# Patient Record
Sex: Female | Born: 1980 | Race: Black or African American | Hispanic: No | Marital: Married | Smoking: Never smoker
Health system: Southern US, Community
[De-identification: ages and names within clinical notes are randomized; demographics above are authoritative.]

## PROBLEM LIST (undated history)

## (undated) ENCOUNTER — Emergency Department (HOSPITAL_COMMUNITY): Admission: EM

## (undated) DIAGNOSIS — M3500 Sicca syndrome, unspecified: Secondary | ICD-10-CM

## (undated) DIAGNOSIS — T7840XA Allergy, unspecified, initial encounter: Secondary | ICD-10-CM

## (undated) DIAGNOSIS — O24419 Gestational diabetes mellitus in pregnancy, unspecified control: Secondary | ICD-10-CM

## (undated) DIAGNOSIS — J45909 Unspecified asthma, uncomplicated: Secondary | ICD-10-CM

## (undated) DIAGNOSIS — R072 Precordial pain: Secondary | ICD-10-CM

## (undated) DIAGNOSIS — R002 Palpitations: Secondary | ICD-10-CM

## (undated) HISTORY — DX: Palpitations: R00.2

## (undated) HISTORY — DX: Precordial pain: R07.2

## (undated) HISTORY — PX: CERVIX SURGERY: SHX593

## (undated) HISTORY — PX: UMBILICAL HERNIA REPAIR: SHX196

## (undated) HISTORY — PX: TONSILLECTOMY: SUR1361

## (undated) HISTORY — DX: Sjogren syndrome, unspecified: M35.00

## (undated) HISTORY — DX: Gestational diabetes mellitus in pregnancy, unspecified control: O24.419

## (undated) HISTORY — DX: Allergy, unspecified, initial encounter: T78.40XA

## (undated) HISTORY — PX: HERNIA REPAIR: SHX51

## (undated) HISTORY — PX: BRAVO PH STUDY: SHX5421

---

## 2008-06-16 HISTORY — PX: UMBILICAL HERNIA REPAIR: SHX196

## 2021-04-19 ENCOUNTER — Other Ambulatory Visit: Payer: Self-pay

## 2021-04-19 DIAGNOSIS — O26851 Spotting complicating pregnancy, first trimester: Secondary | ICD-10-CM | POA: Diagnosis not present

## 2021-04-19 DIAGNOSIS — J45909 Unspecified asthma, uncomplicated: Secondary | ICD-10-CM | POA: Diagnosis not present

## 2021-04-19 DIAGNOSIS — R102 Pelvic and perineal pain: Secondary | ICD-10-CM | POA: Diagnosis not present

## 2021-04-19 DIAGNOSIS — O209 Hemorrhage in early pregnancy, unspecified: Secondary | ICD-10-CM

## 2021-04-19 LAB — URINALYSIS, COMPLETE (UACMP) WITH MICROSCOPIC
Bilirubin Urine: NEGATIVE
Glucose, UA: NEGATIVE mg/dL
Ketones, ur: NEGATIVE mg/dL
Nitrite: NEGATIVE
Protein, ur: NEGATIVE mg/dL
Specific Gravity, Urine: 1.02 (ref 1.005–1.030)
pH: 6 (ref 5.0–8.0)

## 2021-04-19 LAB — BASIC METABOLIC PANEL
Anion gap: 6 (ref 5–15)
BUN: 13 mg/dL (ref 6–20)
CO2: 26 mmol/L (ref 22–32)
Calcium: 9.1 mg/dL (ref 8.9–10.3)
Chloride: 105 mmol/L (ref 98–111)
Creatinine, Ser: 0.84 mg/dL (ref 0.44–1.00)
GFR, Estimated: >60 mL/min (ref >60)
Glucose, Bld: 105 mg/dL — ABNORMAL HIGH (ref 70–99)
Potassium: 3.7 mmol/L (ref 3.5–5.1)
Sodium: 137 mmol/L (ref 135–145)

## 2021-04-19 LAB — CBC
HCT: 33.4 % — ABNORMAL LOW (ref 36.0–46.0)
Hemoglobin: 11.2 g/dL — ABNORMAL LOW (ref 12.0–15.0)
MCH: 28.8 pg (ref 26.0–34.0)
MCHC: 33.5 g/dL (ref 30.0–36.0)
MCV: 85.9 fL (ref 80.0–100.0)
Platelets: 234 10*3/uL (ref 150–400)
RBC: 3.89 MIL/uL (ref 3.87–5.11)
RDW: 13.7 % (ref 11.5–15.5)
WBC: 7.7 10*3/uL (ref 4.0–10.5)
nRBC: 0 % (ref 0.0–0.2)

## 2021-04-19 LAB — ABO/RH: ABO/RH(D): B POS

## 2021-04-19 LAB — POC URINE PREG, ED: Preg Test, Ur: NEGATIVE

## 2021-04-19 NOTE — ED Triage Notes (Signed)
Pt presents to ER c/o vaginal bleeding that started yesterday and was brown.  Pt states today she has had spotty, bright red vaginal bleeding.  Pt c/o intermittent sharp lower abd pain.  Pt states she thinks she is around 5-[redacted] weeks pregnant at this time.  Pt has hx of 3 previous miscarriages.  Pt has not had first OB appt yet.

## 2021-04-20 ENCOUNTER — Emergency Department: Payer: Medicaid Other

## 2021-04-20 ENCOUNTER — Emergency Department
Admission: EM | Admit: 2021-04-20 | Discharge: 2021-04-20 | Disposition: A | Discharge status: Home / Self Care | Payer: Medicaid Other | Attending: Emergency Medicine | Admitting: Emergency Medicine

## 2021-04-20 DIAGNOSIS — Z8759 Personal history of other complications of pregnancy, childbirth and the puerperium: Secondary | ICD-10-CM

## 2021-04-20 DIAGNOSIS — O3680X0 Pregnancy with inconclusive fetal viability, not applicable or unspecified: Secondary | ICD-10-CM

## 2021-04-20 DIAGNOSIS — O209 Hemorrhage in early pregnancy, unspecified: Secondary | ICD-10-CM

## 2021-04-20 DIAGNOSIS — R102 Pelvic and perineal pain: Secondary | ICD-10-CM

## 2021-04-20 HISTORY — DX: Sjogren syndrome, unspecified: M35.00

## 2021-04-20 HISTORY — DX: Unspecified asthma, uncomplicated: J45.909

## 2021-04-20 HISTORY — DX: Personal history of other complications of pregnancy, childbirth and the puerperium: Z87.59

## 2021-04-20 LAB — HCG, QUANTITATIVE, PREGNANCY: hCG, Beta Chain, Quant, S: 17 m[IU]/mL — ABNORMAL HIGH (ref <5)

## 2021-04-20 NOTE — Discharge Instructions (Addendum)
As we discussed, you need to have your pregnancy hormones rechecked in 7 days to make sure that this is not a early pregnancy, ectopic pregnancy, or to confirm that this is a miscarriage.  Follow-up at the health department, with your OB/GYN, or return here for that.  Also return to the emergency room if you have severe abdominal pain

## 2021-04-20 NOTE — ED Notes (Signed)
Pt assessed by provider prior to discharge

## 2021-04-20 NOTE — ED Provider Notes (Signed)
[facility] Emergency Department Provider Note  ____________________________________________  Time seen: Approximately 4:59 AM  I have reviewed the triage vital signs and the nursing notes.   HISTORY  Chief Complaint Vaginal Bleeding   HPI Victoria Hobbs is a 40 y.o. female who presents for evaluation of vaginal bleeding.  Patient reports that her last period was about 6 weeks ago.  She had a positive pregnancy test at home.  yesterday she started having vaginal bleeding and passing clots.  The bleeding has been intermittent and moderate in intensity.  She is complaining of mild intermittent cramping suprapubic abdominal pain associated with it.  Patient reports that this is her 6 pregnancy.  She has 2 children and the other ones end up in miscarriage.  She has a history of uterine fibroids.  She denies any dizziness, chest pain, shortness of breath   Past Medical History:  Diagnosis Date   Asthma    Sjogren's disease ([place])     There are no problems to display for this patient.   History reviewed. No pertinent surgical history.  Prior to Admission medications   Not on File    Allergies Flagyl [metronidazole]  History reviewed. No pertinent family history.  Social History Social History   Tobacco Use   Smoking status: Never   Smokeless tobacco: Never  Substance Use Topics   Alcohol use: Never   Drug use: Never    Review of Systems  Constitutional: Negative for fever. Eyes: Negative for visual changes. ENT: Negative for sore throat. Neck: No neck pain  Cardiovascular: Negative for chest pain. Respiratory: Negative for shortness of breath. Gastrointestinal: + suprapubic cramping. Negative for abdominal pain, vomiting or diarrhea. Genitourinary: Negative for dysuria. + vaginal bleeding Musculoskeletal: Negative for back pain. Skin: Negative for rash. Neurological: Negative for headaches, weakness or numbness. Psych: No SI or  HI  ____________________________________________   PHYSICAL EXAM:  VITAL SIGNS: ED Triage Vitals  Enc Vitals Group     BP 04/19/21 2315 (!) 141/90     Pulse Rate 04/19/21 2315 69     Resp 04/19/21 2315 18     Temp 04/19/21 2315 98.4 F (36.9 C)     Temp Source 04/19/21 2315 Oral     SpO2 04/19/21 2315 100 %     Weight 04/19/21 2325 185 lb (83.9 kg)     Height 04/19/21 2325 5\' 4"  (1.626 m)     Head Circumference --      Peak Flow --      Pain Score 04/19/21 2316 2     Pain Loc --      Pain Edu? --      Excl. in [place]? --     Constitutional: Alert and oriented. Well appearing and in no apparent distress. HEENT:      Head: Normocephalic and atraumatic.         Eyes: Conjunctivae are normal. Sclera is non-icteric.       Mouth/Throat: Mucous membranes are moist.       Neck: Supple with no signs of meningismus. Cardiovascular: Regular rate and rhythm. No murmurs, gallops, or rubs. 2+ symmetrical distal pulses are present in all extremities. No JVD. Respiratory: Normal respiratory effort. Lungs are clear to auscultation bilaterally.  Gastrointestinal: Soft, non tender, and non distended with positive bowel sounds. No rebound or guarding. Genitourinary: No CVA tenderness. Musculoskeletal:  No edema, cyanosis, or erythema of extremities. Neurologic: Normal speech and language. Face is symmetric. Moving all extremities. No gross focal  neurologic deficits are appreciated. Skin: Skin is warm, dry and intact. No rash noted. Psychiatric: Mood and affect are normal. Speech and behavior are normal.  ____________________________________________   LABS (all labs ordered are listed, but only abnormal results are displayed)  Labs Reviewed  HCG, QUANTITATIVE, PREGNANCY - Abnormal; Notable for the following components:      Result Value   hCG, Beta Chain, Quant, S 17 (*)    All other components within normal limits  CBC - Abnormal; Notable for the following components:   Hemoglobin 11.2  (*)    HCT 33.4 (*)    All other components within normal limits  BASIC METABOLIC PANEL - Abnormal; Notable for the following components:   Glucose, Bld 105 (*)    All other components within normal limits  URINALYSIS, COMPLETE (UACMP) WITH MICROSCOPIC - Abnormal; Notable for the following components:   Color, Urine YELLOW (*)    APPearance CLEAR (*)    Hgb urine dipstick LARGE (*)    Leukocytes,Ua TRACE (*)    Bacteria, UA RARE (*)    All other components within normal limits  POC URINE PREG, ED  ABO/RH   ____________________________________________  EKG  none  ____________________________________________  RADIOLOGY  I have personally reviewed the images performed during this visit and I agree with the Radiologist's read.   Interpretation by Radiologist:  US OB LESS THAN 14 WEEKS W/ OB TRANSVAGINAL AND DOPPLER  Result Date: 04/20/2021 CLINICAL DATA:  Vaginal bleeding. EXAM: OBSTETRIC <14 WK [place] AND TRANSVAGINAL OB US DOPPLER ULTRASOUND OF OVARIES TECHNIQUE: Both transabdominal and transvaginal ultrasound examinations were performed for complete evaluation of the gestation as well as the maternal uterus, adnexal regions, and pelvic cul-de-sac. Transvaginal technique was performed to assess early pregnancy. Color and duplex Doppler ultrasound was utilized to evaluate blood flow to the ovaries. COMPARISON:  None. FINDINGS: Intrauterine gestational sac: None Yolk sac:  Not visualized. Embryo:  Not Visualized. Cardiac Activity: No embryo. Subchorionic hemorrhage:  None visualized. Maternal uterus/adnexae: The uterus measures 9.9 x 4.9 by 5.7 cm with an endometrial complex thickness of 6.7 mm and unremarkable endometrial complex. There is a transmural fundal fibroid measuring 1.9 cm and a subserosal fundal fibroid of 1.0 cm with anteverted uterine positioning. Both ovaries are sonographically unremarkable, normal in size, with normal arterial and venous flow registration. No free fluid or  adnexal mass or observed. Pulsed Doppler evaluation of both ovaries demonstrates normal appearing low-resistance arterial and venous waveforms. IMPRESSION: 1. No intrauterine gestational sac or adnexal mass or free fluid are observed. 2. Differential diagnosis is early dates, failed pregnancy and ectopic pregnancy. Follow-up as indicated. 3. 2 small fibroids as described above. Electronically Signed   By: Telford Nab M.D.   On: 04/20/2021 01:51     ____________________________________________   PROCEDURES  Procedure(s) performed: None Procedures   Critical Care performed:  None ____________________________________________   INITIAL IMPRESSION / ASSESSMENT AND PLAN / ED COURSE   40 y.o. female who presents for evaluation of vaginal bleeding. LMP 6 weeks ago with a + home pregnancy test.  Hemodynamically stable, stable hemoglobin.  hCG of 17 with an ultrasound showing no signs of a pregnancy.  Patient is B+ no indication for RhoGAM.  I did discuss with the patient that this is most likely a miscarriage but with hCG of 17 I would not expect to see anything on ultrasound and therefore we cannot rule out a normal IUP versus ectopic pregnancy.  Recommended repeating hCG hormone level in a week.  Also discussed recommendations of returning to the hospital for severe abdominal pain since we are unable to rule out an ectopic pregnancy at this time.  I also discussed signs and symptoms of acute blood loss anemia recommended she returns if those develop.  Otherwise referral to OB/GYN for trending of hCG.  Old medical records reviewed      _____________________________________________ Please note:  Patient was evaluated in Emergency Department today for the symptoms described in the history of present illness. Patient was evaluated in the context of the global COVID-19 pandemic, which necessitated consideration that the patient might be at risk for infection with the SARS-CoV-2 virus that causes  COVID-19. Institutional protocols and algorithms that pertain to the evaluation of patients at risk for COVID-19 are in a state of rapid change based on information released by regulatory bodies including the CDC and federal and state organizations. These policies and algorithms were followed during the patient's care in the ED.  Some ED evaluations and interventions may be delayed as a result of limited staffing during the pandemic.   [place] Controlled Substance Database was reviewed by me. ____________________________________________   FINAL CLINICAL IMPRESSION(S) / ED DIAGNOSES   Final diagnoses:  Vaginal bleeding affecting early pregnancy  Pelvic pain  Pregnancy of unknown anatomic location      NEW MEDICATIONS STARTED DURING THIS VISIT:  ED Discharge Orders     None        Note:  This document was prepared using Dragon voice recognition software and may include unintentional dictation errors.    Rudene Re, MD 04/20/21 [phone]

## 2021-07-06 DIAGNOSIS — L210 Seborrhea capitis: Secondary | ICD-10-CM | POA: Insufficient documentation

## 2021-07-06 DIAGNOSIS — L509 Urticaria, unspecified: Secondary | ICD-10-CM | POA: Insufficient documentation

## 2021-07-06 DIAGNOSIS — J45909 Unspecified asthma, uncomplicated: Secondary | ICD-10-CM | POA: Insufficient documentation

## 2021-07-06 DIAGNOSIS — J309 Allergic rhinitis, unspecified: Secondary | ICD-10-CM | POA: Insufficient documentation

## 2021-07-06 DIAGNOSIS — L309 Dermatitis, unspecified: Secondary | ICD-10-CM | POA: Insufficient documentation

## 2021-07-29 NOTE — Progress Notes (Signed)
Primary Care Provider: Theodoro Clock [facility] Cardiologist: None Electrophysiologist: None  Clinic Note: Chief Complaint  Patient presents with   Other    Sjogren syndrome c/o heart palpitations with chest/shoulder dull pain. Meds reviewed verbally with pt.   ===================================  ASSESSMENT/PLAN   Problem List Items Addressed This Visit     Precordial chest pain    She does not have that many risk factors with exception of significant family history of rheumatologic disorders.  With systemic disease, need to exclude cardiac involvement.  We will check combination of 2D echocardiogram and Coronary CTA (with possible FFRCT)      Relevant Orders   EKG 12-Lead (Completed)   ECHOCARDIOGRAM COMPLETE   Basic Metabolic Panel (BMET)   DOE (dyspnea on exertion)    Probably deconditioning and related to asthma.  However with the increased risk based on rheumatologic disorder, complete baseline cardiac evaluation especially in setting of precordial pain.  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  Also need to evaluate for pulmonary hypertension.  Plan: Check 2D echo      Relevant Medications   SYMBICORT 80-4.5 MCG/ACT inhaler   montelukast (SINGULAIR) 10 MG tablet   albuterol (VENTOLIN HFA) 108 (90 Base) MCG/ACT inhaler   Sjogren syndrome with other organ involvement ([place]) (Chronic)    She sees a pretty stable overall from a symptom standpoint [place] Sjogren's.  However now she has been having some episodes of dyspnea and chest discomfort.  Need to exclude cardiac involvement..  For full cardiac evaluation: 2D Echocardiogram and Coronary CTA      Relevant Orders   EKG 12-Lead (Completed)   ECHOCARDIOGRAM  COMPLETE   Other Visit Diagnoses     Precordial pain    -  Primary   Relevant Orders   CT CORONARY MORPH W/CTA COR W/SCORE W/CA W/CM &/OR WO/CM       ===================================  HPI:    Amiel Sharrow is a 41 y.o. female with history of Uterine Fibroids and Sjogren's Syndrome, and Asthma/Allergic Rhinitis who is being seen today for the evaluation of CHEST PAIN and SHORTNESS OF BREATH ON EXERTION along with Palpitations at the request of Terald Sleeper, PA-C.  Domingue Coltrain was seen Particia Nearing, PA on July 06, 2021 for routine evaluation.  She did note some exertional chest pain.  She also indicated in the past she had been followed by a cardiologist for her Sjogren's.  Had not been seen a few years ago.  She was referred to rheumatology, endocrinology and cardiology because of her history of Sjogren's.  Recent Hospitalizations:  [facility] ER 04/20/2021: Noted some vaginal bleeding.  Last menstrual cycle was 6 weeks prior.  Home positive stress test.  Was concerned because she was passing clots => miscarriage.  This was her sixth pregnancy with 2 children and 3 (now 4) miscarriages.  Reviewed  CV studies:    The following studies were reviewed today: (if available, images/films reviewed: From Epic Chart or Care Everywhere) She tells me in the past she has had at least a stress test and probably an echocardiogram, but has been several years since last evaluation.  Had not seen her cardiologist in [place] for several years.  Interval History:  Desyre Calma presents here today for evaluation, stating that she is actually feeling better overall.  She states that in early December she had an episode of a dull heavy chest pain and pressure that lasted for several minutes.  She thought it was related to her asthma, but did not have any wheezing at that time.  She said that throughout the month of December she was having occasional episodes of heavy sensation, but they are not really  occurring now.  She is also been having some shortness of breath with exertion that she has attributed to many things, but was concerned because of associated with some of the chest discomfort timing.  The chest comfort may or may not of been associated with exertion indicating that sometimes happen at rest and sometimes with exertion.  She has had rare skipping heartbeats but did not occur very often.  Not frequent enough to potentially capture on a monitor.  Maybe once or twice a month at the most.  Not overly worrisome to her just she feels her heart skipping a few times.  She told me that she actually stopped taking her Plaquenil around the time she found out that she was pregnant, but never restarted it after her miscarriage.  CV Review of Symptoms (Summary) Cardiovascular ROS: positive for - chest pain, dyspnea on exertion, irregular heartbeat, and palpitations negative for - edema, orthopnea, paroxysmal nocturnal dyspnea, rapid heart rate, shortness of breath, or  lightheadedness, dizziness, weakness or syncope/near syncope or TIA/amaurosis fugax, claudication  REVIEWED OF SYSTEMS   Review of Systems  Constitutional:  Negative for malaise/fatigue and weight loss.  HENT:  Negative for congestion and nosebleeds.   Respiratory:  Positive for cough (With asthma), shortness of breath (Exertional) and wheezing (With asthma). Negative for hemoptysis.   Cardiovascular:        Per HPI  Gastrointestinal:  Negative for abdominal pain, blood in stool and melena.  Genitourinary:  Negative for dysuria and hematuria.  Musculoskeletal:  Negative for joint pain.  Neurological:  Negative for dizziness.  Psychiatric/Behavioral:  Negative for depression and memory loss. The patient is not nervous/anxious and does not have insomnia.    I have reviewed and (if needed) personally updated the patient's problem list, medications, allergies, past medical and surgical history, social and family history.   PAST  MEDICAL HISTORY   Past Medical History:  Diagnosis Date   Asthma    History of miscarriage 04/20/2021   Fourth miscarriage with 2 births out of 6   Sjogren's syndrome ([place])     PAST SURGICAL HISTORY   Past Surgical History:  Procedure Laterality Date   BRAVO [place] STUDY     CERVIX SURGERY     TONSILLECTOMY     UMBILICAL HERNIA REPAIR  2010    There is no immunization history on file for this patient.  MEDICATIONS/ALLERGIES   Current Meds  Medication Sig   albuterol (VENTOLIN HFA) 108 (90 Base) MCG/ACT inhaler daily.   cetirizine (ZYRTEC) 10 MG tablet Take 10 mg by mouth daily.   fluticasone (FLONASE) 50 MCG/ACT nasal spray in the morning and at bedtime.   hydroxychloroquine (PLAQUENIL) 200 MG tablet Take 200 mg by mouth 2 (two) times daily.   metoprolol tartrate (LOPRESSOR) 100 MG tablet Take 1 tablet (100 mg total) by mouth once for 1 dose. Take two hours prior to your CT.   montelukast (SINGULAIR) 10 MG tablet at bedtime.   SYMBICORT 80-4.5 MCG/ACT inhaler Inhale 2 puffs into the lungs 2 (  two) times daily.    Allergies  Allergen Reactions   Flagyl [Metronidazole] Anaphylaxis and Hives    SOCIAL HISTORY/FAMILY HISTORY   Reviewed in Epic:   Social History   Tobacco Use   Smoking status: Never   Smokeless tobacco: Never  Substance Use Topics   Alcohol use: Never   Drug use: Never   Social History   Social History Narrative   Moved from [place] about a year ago.  Had previously been followed by cardiologist there.   Family History  Problem Relation Age of Onset   Rheum arthritis Mother        Several female relatives had rheumatologic disorders including RA, lupus, Sjogren's etc.   Heart attack Maternal Grandmother     OBJCTIVE -PE, EKG, labs   Wt Readings from Last 3 Encounters:  08/01/21 192 lb 4 oz (87.2 kg)  04/19/21 185 lb (83.9 kg)    Physical Exam: BP 110/72 (BP Location: Right Arm, Patient Position: Sitting, Cuff Size: Normal)    Pulse 87     Ht 5\' 4"  (1.626 m)    Wt 192 lb 4 oz (87.2 kg)    LMP 03/14/2021 (Exact Date)    SpO2 98%    BMI 33.00 kg/m  Physical Exam Vitals reviewed.  Constitutional:      General: She is not in acute distress.    Appearance: Normal appearance. She is normal weight. She is not ill-appearing or toxic-appearing.  HENT:     Head: Normocephalic and atraumatic.  Neck:     Vascular: No carotid bruit or JVD.  Cardiovascular:     Rate and Rhythm: Normal rate and regular rhythm. No extrasystoles are present.    Chest Wall: PMI is not displaced.     Pulses: Normal pulses.     Heart sounds: Normal heart sounds, S1 normal and S2 normal. No murmur (Cannot exclude soft systolic murmur.) heard.   No friction rub. No gallop.  Pulmonary:     Effort: Pulmonary effort is normal. No respiratory distress.     Breath sounds: Normal breath sounds. No wheezing, rhonchi or rales.  Chest:     Chest wall: No tenderness.  Abdominal:     General: Abdomen is flat. Bowel sounds are normal. There is no distension.     Palpations: Abdomen is soft. There is no mass.     Tenderness: There is no abdominal tenderness.     Comments: No HSM or bruit.  Musculoskeletal:        General: No swelling. Normal range of motion.     Cervical back: Normal range of motion and neck supple.  Skin:    General: Skin is warm and dry.  Neurological:     General: No focal deficit present.     Mental Status: She is alert and oriented to person, place, and time.  Psychiatric:        Mood and Affect: Mood normal.        Behavior: Behavior normal.        Thought Content: Thought content normal.        Judgment: Judgment normal.     Adult ECG Report  Rate: 87 ;  Rhythm: normal sinus rhythm and Normal axis, intervals and durations. ;   Narrative Interpretation: Normal  Recent Labs: Reviewed No results found for: CHOL, HDL, LDLCALC, LDLDIRECT, TRIG, CHOLHDL Lab Results  Component Value Date   CREATININE 0.84 04/19/2021   BUN 13  04/19/2021   NA 137 04/19/2021  K 3.7 04/19/2021   CL 105 04/19/2021   CO2 26 04/19/2021   CBC Latest Ref Rng & Units 04/19/2021  WBC 4.0 - 10.5 K/uL 7.7  Hemoglobin 12.0 - 15.0 g/dL 11.2(L)  Hematocrit 36.0 - 46.0 % 33.4(L)  Platelets 150 - 400 K/uL 234    No results found for: HGBA1C No results found for: TSH  ==================================================  COVID-19 Education: The signs and symptoms of COVID-19 were discussed with the patient and how to seek care for testing (follow up with PCP or arrange E-visit).    I spent a total of 35 minutes with the patient spent in direct patient consultation.  Additional time spent with chart review  / charting (studies, outside notes, etc): 20 min Total Time: 55 min  Current medicines are reviewed at length with the patient today.  (+/- concerns) N/A  This visit occurred during the SARS-CoV-2 public health emergency.  Safety protocols were in place, including screening questions prior to the visit, additional usage of staff PPE, and extensive cleaning of exam room while observing appropriate contact time as indicated for disinfecting solutions.  Notice: This dictation was prepared with Dragon dictation along with smart phrase technology. Any transcriptional errors that result from this process are unintentional and may not be corrected upon review.   Studies Ordered:  Orders Placed This Encounter  Procedures   CT CORONARY MORPH W/CTA COR W/SCORE W/CA W/CM &/OR WO/CM   Basic Metabolic Panel (BMET)   EKG 12-Lead   ECHOCARDIOGRAM COMPLETE    Patient Instructions / Medication Changes & Studies & Tests Ordered   Patient Instructions  Medication Instructions:   Your physician recommends that you continue on your current medications as directed. Please refer to the Current Medication list given to you today.   *If you need a refill on your cardiac medications before your next appointment, please call your pharmacy*   Lab  Work:  Your provider has ordered labs (BMET) to be drawn. Please go to the [facility] to have this done.   Medical Mall Entrance at [facility] 1st desk on the right to check in, past the screening table Lab hours: Monday- Friday (7:30 am- 5:30 pm)   If you have labs (blood work) drawn today and your tests are completely normal, you will receive your results only by: [place] (if you have MyChart) OR A paper copy in the mail If you have any lab test that is abnormal or we need to change your treatment, we will call you to review the results.   Testing/Procedures:  Echocardiogram - Your physician has requested that you have an echocardiogram. Echocardiography is a painless test that uses sound waves to create images of your heart. It provides your doctor with information about the size and shape of your heart and how well your hearts chambers and valves are working. This procedure takes approximately one hour. There are no restrictions for this procedure.    2. Your cardiac CT will be scheduled at:  [facility] [address] [place],  [address] [phone]  Please arrive 15 mins early for check-in and test prep.   Follow-Up: At [facility], you and your health needs are our priority.  As part of our continuing mission to provide you with exceptional heart care, we have created designated Provider Care Teams.  These Care Teams include your primary Cardiologist (physician) and Advanced Practice Providers (APPs -  Physician Assistants and Nurse Practitioners) who all work together to  provide you with the care you need, when you need it.  We recommend signing up for the patient portal called "MyChart".  Sign up information is provided on this After Visit Summary.  MyChart is used to connect with patients for Virtual Visits (Telemedicine).  Patients are able to view lab/test results, encounter notes, upcoming appointments, etc.   Non-urgent messages can be sent to your provider as well.   To learn more about what you can do with MyChart, go to [email or web address].    Your next appointment:   6 week(s)  The format for your next appointment:   In Person  Provider:   You may see Glenetta Hew, MD or one of the following Advanced Practice Providers on your designated Care Team:   Murray Hodgkins, NP Christell Faith, PA-C Cadence Kathlen Mody, PA-C:1}    Other Instructions N/A    Glenetta Hew, M.D., M.S. Interventional Cardiologist   Pager # [phone] Phone # [phone] [address] [place], [place] [address]   Thank you for choosing [facility] in [place]!!

## 2021-07-29 NOTE — Progress Notes (Deleted)
Heart and cardiovascular system -- SS may be associated with increased risk for cardiovascular disease. Pericarditis and myocardial disease may occur, but are rarely evident clinically, and heart block is also rare but may occur in adults with SS. In cohort studies, SS was an independent risk factor for arterial wall thickening (a marker of subclinical atherosclerosis) [95,96], cerebrovascular events and myocardial infarction [97-99], venous thromboembolism [98], hypertension, and hypertriglyceridemia [100]. In one study, the increased risk of cardiovascular disease was highest among those patients with both anti-Ro/SSA and anti-La/SSB antibodies [98]. By contrast, in a study of 312 patients with primary SS, the frequency of cardiovascular disease in primary SS patients was no different from matched controls without autoimmune disease [101]. Similarly, the risk of ischemic heart disease was modestly decreased among SS patients, compared with controls, in a cross-sectional study of a large administrative database of inpatient encounters at [facility]. Acute pericarditis and myocarditis are rare complications of primary SS [103,104], but echocardiographic evidence of prior pericarditis or left ventricular diastolic dysfunction is more common [103]. In an echocardiographic study of 107 consecutive primary SS patients without clinically apparent heart disease and 62 age- and sex-matched healthy controls, valvular regurgitation, clinically silent pericardial effusion, pulmonary hypertension, and increased left ventricular mass index were significantly more prevalent in the SS patients [105]. Hypocomplementemia, age, and cryoglobulinemia were predictors of some of these findings. Heart block is rare in adult SS patients and is not consistently associated with anti-SSA/Ro antibodies [106]. Congenital heart block may develop in the fetus of a pregnant woman with SS as a result of transplacental  passage of anti-SSA/Ro antibodies. (See "Neonatal lupus: Epidemiology, pathogenesis, clinical manifestations, and diagnosis".)

## 2021-08-01 ENCOUNTER — Ambulatory Visit: Payer: Medicaid Other | Admitting: Cardiology

## 2021-08-01 ENCOUNTER — Encounter: Payer: Self-pay | Admitting: Cardiology

## 2021-08-01 ENCOUNTER — Other Ambulatory Visit: Payer: Self-pay

## 2021-08-01 VITALS — BP 110/72 | HR 87 | Ht 64.0 in | Wt 192.2 lb

## 2021-08-01 DIAGNOSIS — J45909 Unspecified asthma, uncomplicated: Secondary | ICD-10-CM | POA: Insufficient documentation

## 2021-08-01 DIAGNOSIS — R0609 Other forms of dyspnea: Secondary | ICD-10-CM | POA: Diagnosis not present

## 2021-08-01 DIAGNOSIS — R072 Precordial pain: Secondary | ICD-10-CM | POA: Insufficient documentation

## 2021-08-01 DIAGNOSIS — M3509 Sicca syndrome with other organ involvement: Secondary | ICD-10-CM

## 2021-08-01 MED ORDER — METOPROLOL TARTRATE 100 MG PO TABS: 100.0000 mg | ORAL_TABLET | Freq: Once | ORAL | 0 refills | Status: DC

## 2021-08-01 NOTE — Patient Instructions (Signed)
Medication Instructions:   Your physician recommends that you continue on your current medications as directed. Please refer to the Current Medication list given to you today.   *If you need a refill on your cardiac medications before your next appointment, please call your pharmacy*   Lab Work:  Your provider has ordered labs (BMET) to be drawn. Please go to the [facility] to have this done.   [facility] Mall Entrance at [facility] 1st desk on the right to check in, past the screening table Lab hours: Monday- Friday (7:30 am- 5:30 pm)   If you have labs (blood work) drawn today and your tests are completely normal, you will receive your results only by: [place] (if you have MyChart) OR A paper copy in the mail If you have any lab test that is abnormal or we need to change your treatment, we will call you to review the results.   Testing/Procedures:  Echocardiogram - Your physician has requested that you have an echocardiogram. Echocardiography is a painless test that uses sound waves to create images of your heart. It provides your doctor with information about the size and shape of your heart and how well your hearts chambers and valves are working. This procedure takes approximately one hour. There are no restrictions for this procedure.    2. Your cardiac CT will be scheduled at:  [facility] [address] [place], [place] [address] [phone]  Please arrive 15 mins early for check-in and test prep.    Please follow these instructions carefully (unless otherwise directed):   On the Night Before the Test: Be sure to Drink plenty of water. Do not consume any caffeinated/decaffeinated beverages or chocolate 12 hours prior to your test. If the patient has contrast allergy: Patient will need a prescription for Prednisone and very clear instructions (as follows): Prednisone 50 mg - take 13 hours prior to test Take another  Prednisone 50 mg 7 hours prior to test Take another Prednisone 50 mg 1 hour prior to test Take Benadryl 50 mg 1 hour prior to test Patient must complete all four doses of above prophylactic medications. Patient will need a ride after test due to Benadryl.  On the Day of the Test: Drink plenty of water until 1 hour prior to the test. Do not eat any food 4 hours prior to the test. You may take your regular medications prior to the test.  Take metoprolol (Lopressor) two hours prior to test. FEMALES- please wear underwire-free bra if available, avoid dresses & tight clothing       After the Test: Drink plenty of water. After receiving IV contrast, you may experience a mild flushed feeling. This is normal. On occasion, you may experience a mild rash up to 24 hours after the test. This is not dangerous. If this occurs, you can take Benadryl 25 mg and increase your fluid intake. If you experience trouble breathing, this can be serious. If it is severe call [phone] IMMEDIATELY. If it is mild, please call our office.   Please allow 2-4 weeks for scheduling of routine cardiac CTs. Some insurance companies require a pre-authorization which may delay scheduling of this test.   For non-scheduling related questions, please contact the cardiac imaging nurse navigator should you have any questions/concerns: Marchia Bond, Cardiac Imaging Nurse Navigator Gordy Clement, Cardiac Imaging Nurse Navigator [facility] Direct Office Dial: [phone]   For scheduling needs, including cancellations and rescheduling, please  call [place], [phone].     Follow-Up: At [facility], you and your health needs are our priority.  As part of our continuing mission to provide you with exceptional heart care, we have created designated Provider Care Teams.  These Care Teams include your primary Cardiologist (physician) and Advanced Practice Providers (APPs -  Physician Assistants and  Nurse Practitioners) who all work together to provide you with the care you need, when you need it.  We recommend signing up for the patient portal called "MyChart".  Sign up information is provided on this After Visit Summary.  MyChart is used to connect with patients for Virtual Visits (Telemedicine).  Patients are able to view lab/test results, encounter notes, upcoming appointments, etc.  Non-urgent messages can be sent to your provider as well.   To learn more about what you can do with MyChart, go to [email or web address].    Your next appointment:   6 week(s)  The format for your next appointment:   In Person  Provider:   You may see Glenetta Hew, MD or one of the following Advanced Practice Providers on your designated Care Team:   Murray Hodgkins, NP Christell Faith, PA-C Cadence Kathlen Mody, PA-C:1}    Other Instructions N/A

## 2021-08-05 ENCOUNTER — Encounter: Payer: Self-pay | Admitting: Cardiology

## 2021-08-05 NOTE — Assessment & Plan Note (Signed)
Probably deconditioning and related to asthma.  However with the increased risk based on rheumatologic disorder, complete baseline cardiac evaluation especially in setting of precordial pain.  Plan: 2D echo and Coronary CTA/FFR ct

## 2021-08-05 NOTE — Assessment & Plan Note (Signed)
She sees a pretty stable overall from a symptom standpoint [place] Sjogren's.  However now she has been having some episodes of dyspnea and chest discomfort.  Need to exclude cardiac involvement..  For full cardiac evaluation: 2D Echocardiogram and Coronary CTA

## 2021-08-05 NOTE — Assessment & Plan Note (Signed)
I suspect that some of her symptoms are related to asthma, however still need to exclude cardiac etiology.  Also need to evaluate for pulmonary hypertension.  Plan: Check 2D echo

## 2021-08-05 NOTE — Assessment & Plan Note (Signed)
She does not have that many risk factors with exception of significant family history of rheumatologic disorders.  With systemic disease, need to exclude cardiac involvement.  We will check combination of 2D echocardiogram and Coronary CTA (with possible FFRCT)

## 2021-08-07 ENCOUNTER — Telehealth (HOSPITAL_COMMUNITY): Payer: Self-pay | Admitting: *Deleted

## 2021-08-07 NOTE — Telephone Encounter (Signed)
Reaching out to patient to offer assistance regarding upcoming cardiac imaging study; pt verbalizes understanding of appt date/time, parking situation and where to check in, pre-test NPO status and medications ordered, and verified current allergies; name and call back number provided for further questions should they arise ? ?Stevens Magwood RN Navigator Cardiac Imaging ?[facility] ?[phone] office ?[phone] cell ? ?Patient to take 100mg metoprolol tartrate two hours prior to her cardiac CT scan.  ?

## 2021-08-08 ENCOUNTER — Ambulatory Visit
Admission: RE | Admit: 2021-08-08 | Discharge: 2021-08-08 | Disposition: A | Discharge status: Home / Self Care | Payer: Medicaid Other | Source: Ambulatory Visit | Attending: Cardiology | Admitting: Cardiology

## 2021-08-08 ENCOUNTER — Other Ambulatory Visit: Payer: Self-pay

## 2021-08-08 DIAGNOSIS — R072 Precordial pain: Secondary | ICD-10-CM | POA: Insufficient documentation

## 2021-08-08 MED ORDER — IOHEXOL 350 MG/ML SOLN
100.0000 mL | Freq: Once | INTRAVENOUS | Status: AC | PRN
  Administered 2021-08-08: 100 mL via INTRAVENOUS

## 2021-08-08 MED ORDER — DILTIAZEM HCL 25 MG/5ML IV SOLN
5.0000 mg | Freq: Once | INTRAVENOUS | Status: AC
  Administered 2021-08-08: 5 mg via INTRAVENOUS

## 2021-08-08 MED ORDER — NITROGLYCERIN 0.4 MG SL SUBL: 0.8000 mg | SUBLINGUAL_TABLET | Freq: Once | SUBLINGUAL | Status: DC

## 2021-08-08 MED ORDER — METOPROLOL TARTRATE 5 MG/5ML IV SOLN
10.0000 mg | Freq: Once | INTRAVENOUS | Status: AC
  Administered 2021-08-08: 10 mg via INTRAVENOUS

## 2021-08-08 MED ORDER — NITROGLYCERIN 0.4 MG SL SUBL
0.4000 mg | SUBLINGUAL_TABLET | SUBLINGUAL | Status: DC | PRN
  Administered 2021-08-08: 0.4 mg via SUBLINGUAL

## 2021-08-08 NOTE — Progress Notes (Signed)
Patient tolerated procedure well. Ambulate w/o difficulty. Denies light headedness or being dizzy. Sitting in chair drinking water provided. Encouraged to drink extra water today and reasoning explained. Verbalized understanding. All questions answered. ABC intact. No further needs. Discharge from procedure area w/o issues.   °

## 2021-08-12 ENCOUNTER — Telehealth: Payer: Self-pay | Admitting: Emergency Medicine

## 2021-08-12 NOTE — Telephone Encounter (Signed)
-----   Message from Leonie Man, MD sent at 08/10/2021 12:13 AM EST ----- Coronary CT angiogram results: Great news  Coronary Calcium Score 0.  Angiogram shows no evidence of any notable plaque.  Normal size and distribution of the arteries.  Overall this is a very reassuring study.  No evidence of heart artery disease.  Would definitely consider chest pain not cardiac.  Glenetta Hew, MD

## 2021-08-12 NOTE — Telephone Encounter (Signed)
Called and spoke with patient. Results reviewed with patient, pt verbalized understanding,  questions (if any) answered.   ?

## 2021-08-25 NOTE — Progress Notes (Signed)
Office Visit Note  Patient: Victoria Hobbs             Date of Birth: 25-Jul-1980           MRN: [id number]             PCP: Terald Sleeper, PA-C Referring: Terald Sleeper, PA-C Visit Date: 08/26/2021   Subjective:  New Patient (Initial Visit) (Transfer of care from [place])   History of Present Illness: Victoria Hobbs is a 41 y.o. female here for sjogren's syndrome. She was taking hydroxychloroquine 200 mg BID for joint and organ involvement but is off this medication since a few years ago. She started suffering from chronic fatigue and joint pain in multiple areas since over 10 years ago but no underlying cause identified for a few years. She also developed dry eyes and mouth and then had discoloration of her hands started intermittently prompting lab testing due to raynaud's symptoms. She started on plaquenil that was somewhat helpful for symptoms. However she was concerned about this contributing to recurrent miscarriages so stopped taking this in 2020 and had a successful delivery. Labs previously showing low positive SSA Abs and RF. She is not sure whether tested or treatment for APS, she is taking aspirin 81 mg daily. 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 These were itching and sometimes burning discomfort but came and went periodically lasting less than 1 year in total. She has some ongoing skin rashes with eczema and with recurrent seborrheic capitis using topical triamcinolone for this. For dry eyes she uses systane drops which are helpful but expensive to use all the times she has symptoms. She was seeing ophthalmology regularly for this and for HCQ monitoring but not since about 3 years ago. She has dry mouth and has multiple cavities she drinks a lot of water and uses sugar free gum or lozenges frequently. She has a sore throat a lot no specific trouble swallowing no problems with  thrush.  Activities of Daily Living:  Patient reports morning stiffness for 24 hours.   Patient Denies nocturnal pain.  Difficulty dressing/grooming: Denies Difficulty climbing stairs: Reports Difficulty getting out of chair: Reports Difficulty using hands for taps, buttons, cutlery, and/or writing: Reports  Review of Systems  Constitutional:  Positive for fatigue.  HENT:  Positive for mouth dryness.   Eyes:  Positive for dryness.  Respiratory:  Positive for shortness of breath.   Cardiovascular:  Negative for swelling in legs/feet.  Gastrointestinal:  Positive for constipation and diarrhea.  Endocrine: Positive for cold intolerance and heat intolerance.  Genitourinary:  Negative for difficulty urinating.  Musculoskeletal:  Positive for joint pain, joint pain, muscle weakness, morning stiffness and muscle tenderness.  Skin:  Positive for rash.  Allergic/Immunologic: Negative for susceptible to infections.  Neurological:  Positive for weakness.  Hematological:  Negative for bruising/bleeding tendency.  Psychiatric/Behavioral:  Positive for sleep disturbance.    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   Precordial chest pain 08/01/2021   DOE (dyspnea on exertion) 08/01/2021   Sjogren syndrome with other organ involvement ([place]) 08/01/2021   Urticaria 07/06/2021   Seborrhea capitis 07/06/2021   Eczema 07/06/2021   Allergic rhinitis 07/06/2021    Past Medical History:  Diagnosis  Date   Asthma    History of miscarriage 04/20/2021   Fourth miscarriage with 2 births out of 6   Sjogren's syndrome ([place])     Family History  Problem Relation Age of Onset   Asthma Mother    Thyroid disease Sister    Sarcoidosis Brother    Crohn's disease Brother    Heart attack Maternal Grandmother    Past Surgical History:   Procedure Laterality Date   BRAVO [place] STUDY     CERVIX SURGERY     TONSILLECTOMY     UMBILICAL HERNIA REPAIR  2010   Social History   Social History Narrative   Moved from [place] about a year ago.  Had previously been followed by cardiologist there.   Immunization History  Administered Date(s) Administered   [employer] Vaccination 06/22/2019, 07/20/2019     Objective: Vital Signs: BP 121/74 (BP Location: Right Arm, Patient Position: Sitting, Cuff Size: Normal)    Pulse 84    Resp 15    Ht '5\' 4"'$  (1.626 m)    Wt 198 lb (89.8 kg)    Breastfeeding No    BMI 33.99 kg/m    Physical Exam HENT:     Mouth/Throat:     Mouth: Mucous membranes are moist.     Pharynx: Oropharynx is clear.     Comments: Several dental fillings, no visible gingival or tongue surface changes Eyes:     Conjunctiva/sclera: Conjunctivae normal.  Cardiovascular:     Rate and Rhythm: Normal rate and regular rhythm.  Pulmonary:     Effort: Pulmonary effort is normal.     Breath sounds: Normal breath sounds.  Musculoskeletal:     Right lower leg: No edema.     Left lower leg: No edema.  Skin:    General: Skin is warm and dry.  Neurological:     Mental Status: She is alert.  Psychiatric:        Mood and Affect: Mood normal.     Musculoskeletal Exam:  Neck full ROM no tenderness Shoulders full ROM some left shoulder pain with empty can test, no focal tenderness Elbows full ROM no tenderness or swelling Wrists full ROM no tenderness or swelling Fingers full ROM no tenderness or swelling Knees full ROM no tenderness or swelling, slight right knee patellofemoral crepitus Ankles full ROM no tenderness or swelling   Investigation: No additional findings.  Imaging: CT CORONARY MORPH W/CTA COR W/SCORE W/CA W/CM &/OR WO/CM  Addendum Date: 08/08/2021   ADDENDUM REPORT: 08/08/2021 16:27 EXAM: OVER-READ INTERPRETATION  CT CHEST The following report is an over-read performed by radiologist Dr.  Estanislado Pandy [facility] on 08/08/2021. This over-read does not include interpretation of cardiac or coronary anatomy or pathology. The coronary calcium score/coronary CTA interpretation by the cardiologist is attached. COMPARISON:  None. FINDINGS: Images of the upper abdomen are unremarkable. Visualized mediastinal structures are normal. 3 mm calcified granuloma in the left upper lobe on sequence 14, image 11. Otherwise, the visualized lungs are clear. No acute bone abnormality. IMPRESSION: No acute extracardiac findings. Electronically Signed   By: Markus Daft M.D.   On: 08/08/2021 16:27   Result Date: 08/08/2021 CLINICAL DATA:  Chest pain EXAM: Cardiac/Coronary  CTA TECHNIQUE: The patient was scanned on a Siemens Somatom go.Top scanner. : A retrospective scan was triggered in the descending thoracic aorta. Axial non-contrast 3 mm slices were carried out through the heart. The data set was analyzed on a dedicated work station and scored  using the Agatson method. Gantry rotation speed was 330 msecs and collimation was .6 mm. '100mg'$  of metoprolol and 0.8 mg of sl NTG was given. The 3D data set was reconstructed in 5% intervals of the 60-95 % of the R-R cycle. Diastolic phases were analyzed on a dedicated work station using MPR, MIP and VRT modes. The patient received 75 cc of contrast. FINDINGS: Aorta:  Normal size.  No calcifications.  No dissection. Aortic Valve:  Trileaflet.  No calcifications. Coronary Arteries:  Normal coronary origin.  Right dominance. RCA is a dominant artery that gives rise to PDA and PLA. There is no plaque. Left main gives rise to LAD and LCX arteries. There is no LM disease. LAD has no plaque. LCX is a non-dominant artery that gives rise to one OM1 branch. There is no plaque. Other findings: Normal pulmonary vein drainage into the left atrium. Normal left atrial appendage without a thrombus. Normal size of the pulmonary artery. IMPRESSION: 1. Coronary calcium score of 0.  Patient is low risk for coronary events. 2. Normal coronary origin with right dominance. 3. No evidence of CAD. 4. CAD-RADS 0. Consider non-atherosclerotic causes of chest pain. Electronically Signed: By: Kate Sable M.D. On: 08/08/2021 15:28    Recent Labs: Lab Results  Component Value Date   WBC 7.7 04/19/2021   HGB 11.2 (L) 04/19/2021   PLT 234 04/19/2021   NA 137 04/19/2021   K 3.7 04/19/2021   CL 105 04/19/2021   CO2 26 04/19/2021   GLUCOSE 105 (H) 04/19/2021   BUN 13 04/19/2021   CREATININE 0.84 04/19/2021   CALCIUM 9.1 04/19/2021    Speciality Comments: No specialty comments available.  Procedures:  No procedures performed Allergies: Metronidazole   Assessment / Plan:     Visit Diagnoses: Sjogren syndrome with other organ involvement ([place]) - Plan: Sjogrens syndrome-A extractable nuclear antibody, C3 and C4, Sedimentation rate, Rheumatoid factor  Symptoms do not appear excessive right now for being off any maintenance treatment.  We will recheck markers for disease activity including Ro antibody, complements, sed rate, and rheumatoid factor which have previously been abnormal.  I do think hydroxychloroquine is the most sensible DMARD treatment if resuming anything for systemic disease activity.  Urticaria  Not an active problem reported symptoms did not have features concerning for urticarial vasculitis.  History of recurrent miscarriages - Plan: Beta-2 glycoprotein antibodies, Cardiolipin antibodies, IgG, IgM, IgA, Lupus Anticoagulant Eval w/Reflex  History of 4 miscarriages with abnormal antibodies and possible Raynaud's along with Sjogren's syndrome we will check for APS antibodies.  She is already on low-dose maintenance aspirin.  No personal history of abnormal bleeding or blood clots.  Patient is not clear about what previous screening has been completed for this.  Bilateral dry eyes  Using Systane drops with reasonable improvement would benefit to be  established with ophthalmology locally.  Seborrhea capitis  The seborrheic capitis is not a particular association with Sjogren syndrome.  Ongoing topical steroid treatment for this.  Orders: Orders Placed This Encounter  Procedures   Sjogrens syndrome-A extractable nuclear antibody   C3 and C4   Beta-2 glycoprotein antibodies   Cardiolipin antibodies, IgG, IgM, IgA   Lupus Anticoagulant Eval w/Reflex   Sedimentation rate   Rheumatoid factor   No orders of the defined types were placed in this encounter.    Follow-Up Instructions: Return in about 3 months (around 11/26/2021) for New pt sjogren syndrome f/u 2mo.   CCollier Salina MD  Note -  This record has been created using Dragon software.  °Chart creation errors have been sought, but may not always  °have been located. Such creation errors do not reflect on  °the standard of medical care. ° °

## 2021-08-26 ENCOUNTER — Encounter: Payer: Self-pay | Admitting: Internal Medicine

## 2021-08-26 ENCOUNTER — Other Ambulatory Visit: Payer: Self-pay

## 2021-08-26 ENCOUNTER — Ambulatory Visit (INDEPENDENT_AMBULATORY_CARE_PROVIDER_SITE_OTHER): Payer: Medicaid Other | Admitting: Internal Medicine

## 2021-08-26 VITALS — BP 121/74 | HR 84 | Resp 15 | Ht 64.0 in | Wt 198.0 lb

## 2021-08-26 DIAGNOSIS — H04123 Dry eye syndrome of bilateral lacrimal glands: Secondary | ICD-10-CM | POA: Insufficient documentation

## 2021-08-26 DIAGNOSIS — L210 Seborrhea capitis: Secondary | ICD-10-CM

## 2021-08-26 DIAGNOSIS — L509 Urticaria, unspecified: Secondary | ICD-10-CM

## 2021-08-26 DIAGNOSIS — N96 Recurrent pregnancy loss: Secondary | ICD-10-CM

## 2021-08-26 DIAGNOSIS — M3509 Sicca syndrome with other organ involvement: Secondary | ICD-10-CM

## 2021-08-26 HISTORY — DX: Recurrent pregnancy loss: N96

## 2021-08-27 DIAGNOSIS — M950 Acquired deformity of nose: Secondary | ICD-10-CM | POA: Insufficient documentation

## 2021-08-29 ENCOUNTER — Other Ambulatory Visit: Payer: Medicaid Other

## 2021-08-29 LAB — LUPUS ANTICOAGULANT EVAL W/ REFLEX
PTT-LA Screen: 28 s (ref <=40)
dRVVT: 32 s (ref <=45)

## 2021-08-29 LAB — SEDIMENTATION RATE: Sed Rate: 11 mm/h (ref 0–20)

## 2021-08-29 LAB — CARDIOLIPIN ANTIBODIES, IGG, IGM, IGA
Anticardiolipin IgA: <2 APL-U/mL (ref <20.0)
Anticardiolipin IgG: <2 GPL-U/mL (ref <20.0)
Anticardiolipin IgM: <2 MPL-U/mL (ref <20.0)

## 2021-08-29 LAB — BETA-2 GLYCOPROTEIN ANTIBODIES
Beta-2 Glyco 1 IgA: <2 U/mL (ref <20.0)
Beta-2 Glyco 1 IgM: <2 U/mL (ref <20.0)
Beta-2 Glyco I IgG: <2 U/mL (ref <20.0)

## 2021-08-29 LAB — C3 AND C4
C3 Complement: 158 mg/dL (ref 83–193)
C4 Complement: 45 mg/dL (ref 15–57)

## 2021-08-29 LAB — RHEUMATOID FACTOR: Rheumatoid fact SerPl-aCnc: <14 IU/mL (ref <14)

## 2021-08-29 LAB — SJOGRENS SYNDROME-A EXTRACTABLE NUCLEAR ANTIBODY: SSA (Ro) (ENA) Antibody, IgG: 4.4 AI — AB

## 2021-09-12 ENCOUNTER — Ambulatory Visit: Payer: Medicaid Other | Admitting: Cardiology

## 2021-09-13 ENCOUNTER — Other Ambulatory Visit: Payer: Medicaid Other

## 2021-09-17 ENCOUNTER — Ambulatory Visit: Payer: Medicaid Other | Admitting: Internal Medicine

## 2021-10-02 ENCOUNTER — Ambulatory Visit: Payer: Medicaid Other | Admitting: Nurse Practitioner

## 2021-10-09 ENCOUNTER — Other Ambulatory Visit: Payer: Medicaid Other

## 2021-10-11 ENCOUNTER — Ambulatory Visit (INDEPENDENT_AMBULATORY_CARE_PROVIDER_SITE_OTHER): Payer: Medicaid Other | Admitting: Obstetrics and Gynecology

## 2021-10-11 ENCOUNTER — Encounter: Payer: Self-pay | Admitting: Obstetrics and Gynecology

## 2021-10-11 VITALS — BP 120/77 | HR 69 | Ht 64.0 in | Wt 187.6 lb

## 2021-10-11 DIAGNOSIS — Z7689 Persons encountering health services in other specified circumstances: Secondary | ICD-10-CM

## 2021-10-11 DIAGNOSIS — Z3009 Encounter for other general counseling and advice on contraception: Secondary | ICD-10-CM

## 2021-10-11 DIAGNOSIS — N96 Recurrent pregnancy loss: Secondary | ICD-10-CM | POA: Diagnosis not present

## 2021-10-11 DIAGNOSIS — M3500 Sicca syndrome, unspecified: Secondary | ICD-10-CM | POA: Diagnosis not present

## 2021-10-11 NOTE — Progress Notes (Signed)
HPI: ?     [name] is a 41 y.o. [id number] who LMP was Patient's last menstrual period was 10/07/2021. ? ?Subjective:  ? ?She presents today to discuss family-planning.  She has had 2 successful pregnancies but 4 miscarriages.  She has been seeing a rheumatologist because she has Sjogren's syndrome.  He did a work-up for multiple miscarriages including lupus anticoagulant anticardiolipin rheumatoid factor etc.  These are all negative.  Sjogren syndrome antibodies are positive. ?One of her successful pregnancies involved taking aspirin in the first trimester but otherwise no significant medication. ?She is not currently trying to become pregnant but not actively preventing. ? ?  Hx: ?The following portions of the patient's history were reviewed and updated as appropriate: ?            She  has a past medical history of Asthma, History of miscarriage (04/20/2021), and Sjogren's syndrome ([place]). ?She does not have any pertinent problems on file. ?She  has a past surgical history that includes Umbilical hernia repair (2010); Tonsillectomy; Cervix surgery; and BRAVO ph study. ?Her family history includes Asthma in her mother; Crohn's disease in her brother; Heart attack in her maternal grandmother; Sarcoidosis in her brother; Thyroid disease in her sister. ?She  reports that she has never smoked. She has never used smokeless tobacco. She reports current alcohol use. She reports that she does not use drugs. ?She has a current medication list which includes the following prescription(s): albuterol, cetirizine, cetirizine, fluticasone, hydroxychloroquine, montelukast, symbicort, and triamcinolone ointment. ?She is allergic to metronidazole. ?      ?Review of Systems:  ?Review of Systems ? ?Constitutional: Denied constitutional symptoms, night sweats, recent illness, fatigue, fever, insomnia and weight loss.  ?Eyes: Denied eye symptoms, eye pain, photophobia, vision change and visual disturbance.   ?Ears/Nose/Throat/Neck: Denied ear, nose, throat or neck symptoms, hearing loss, nasal discharge, sinus congestion and sore throat.  ?Cardiovascular: Denied cardiovascular symptoms, arrhythmia, chest pain/pressure, edema, exercise intolerance, orthopnea and palpitations.  ?Respiratory: Denied pulmonary symptoms, asthma, pleuritic pain, productive sputum, cough, dyspnea and wheezing.  ?Gastrointestinal: Denied, gastro-esophageal reflux, melena, nausea and vomiting.  ?Genitourinary: Denied genitourinary symptoms including symptomatic vaginal discharge, pelvic relaxation issues, and urinary complaints.  ?Musculoskeletal: Denied musculoskeletal symptoms, stiffness, swelling, muscle weakness and myalgia.  ?Dermatologic: Denied dermatology symptoms, rash and scar.  ?Neurologic: Denied neurology symptoms, dizziness, headache, neck pain and syncope.  ?Psychiatric: Denied psychiatric symptoms, anxiety and depression.  ?Endocrine: Denied endocrine symptoms including hot flashes and night sweats.  ? ?Meds: ?  ?Current Outpatient Medications on File Prior to Visit  ?Medication Sig Dispense Refill  ? albuterol (VENTOLIN HFA) 108 (90 Base) MCG/ACT inhaler daily.    ? cetirizine (ZYRTEC) 10 MG tablet Take 10 mg by mouth daily.    ? cetirizine (ZYRTEC) 10 MG tablet     ? fluticasone (FLONASE) 50 MCG/ACT nasal spray in the morning and at bedtime.    ? hydroxychloroquine (PLAQUENIL) 200 MG tablet Take 200 mg by mouth 2 (two) times daily.    ? montelukast (SINGULAIR) 10 MG tablet at bedtime.    ? SYMBICORT 80-4.5 MCG/ACT inhaler Inhale 2 puffs into the lungs 2 (two) times daily.    ? triamcinolone ointment (KENALOG) 0.1 % APPLY TO AFFECTED AREA TWICE A DAY    ? ?No current facility-administered medications on file prior to visit.  ? ? ? ? ?Objective:  ?  ? ?Vitals:  ? 10/11/21 1011  ?BP: 120/77  ?Pulse: 69  ? ?Filed Weights  ?  10/11/21 1011  ?Weight: 187 lb 9.6 oz (85.1 kg)  ? ?  ?          ?        ? ?Assessment:  ?   ?[id number] ?Patient Active Problem List  ? Diagnosis Date Noted  ? 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 Bilateral dry eyes 08/26/2021  ? 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 Precordial chest pain 08/01/2021  ? DOE (dyspnea on exertion) 08/01/2021  ? Sjogren syndrome with other organ involvement ([place] 08/01/2021  ? Urticaria 07/06/2021  ? 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 Eczema 07/06/2021  ? Allergic rhinitis 07/06/2021  ? ?  ?1. Establishing care with new doctor, encounter for   ?2. Family planning counseling   ?3. Sjogren's syndrome without extraglandular involvement ([place])   ?4. History of recurrent miscarriages   ? ? Antibody work-up otherwise negative ? ? ?Plan:  ?  ?       ? 1.  Recommend follow-up with rheumatology as scheduled. ? 2.  Follow-up.  As soon as she is pregnant for early ultrasound.  Consideration for first trimester aspirin. ? 3.  Prenatal vitamins discussed. ? ?Orders ?No orders of the defined types were placed in this encounter. ? ? No orders of the defined types were placed in this encounter. ?  ?  F/U ? No follow-ups on file. ?I spent 34 minutes involved in the care of this patient preparing to see the patient by obtaining and reviewing her medical history (including labs, imaging tests and prior procedures), documenting clinical information in the electronic health record (EHR), counseling and coordinating care plans, writing and sending prescriptions, ordering tests or procedures and in direct communicating with the patient and medical staff discussing pertinent items from her history and physical exam. ? ?Finis Bud, M.D. ?10/11/2021 ?10:43 AM ? ? ? ? ?

## 2021-10-11 NOTE — Progress Notes (Signed)
Patient presents today to discuss family planning. She states she is wanting to become pregnant, not currently on birth control. Patient is a [id number]. Patient states she is working with rheumatology. No other questions or concerns at this time.  ?

## 2021-10-23 ENCOUNTER — Ambulatory Visit: Payer: Medicaid Other | Admitting: Nurse Practitioner

## 2021-11-14 ENCOUNTER — Ambulatory Visit: Payer: Medicaid Other | Admitting: Obstetrics and Gynecology

## 2021-11-14 ENCOUNTER — Encounter: Payer: Self-pay | Admitting: Obstetrics and Gynecology

## 2021-11-14 VITALS — BP 108/71 | HR 84 | Ht 64.0 in | Wt 188.1 lb

## 2021-11-14 DIAGNOSIS — Z3200 Encounter for pregnancy test, result unknown: Secondary | ICD-10-CM

## 2021-11-14 DIAGNOSIS — M3500 Sicca syndrome, unspecified: Secondary | ICD-10-CM | POA: Diagnosis not present

## 2021-11-14 DIAGNOSIS — N96 Recurrent pregnancy loss: Secondary | ICD-10-CM

## 2021-11-14 LAB — POCT URINE PREGNANCY: Preg Test, Ur: POSITIVE — AB

## 2021-11-14 NOTE — Progress Notes (Signed)
HPI:      [name] is a 41 y.o. [id number] who LMP was Patient's last menstrual period was 10/07/2021.  Subjective:   She presents today for pregnancy confirmation.  She is currently taking prenatal vitamins and baby aspirin.  She has a history of Sjogren syndrome and has had multiple miscarriages as well as 2 successful pregnancies.  One of her successful pregnancies involve the use of aspirin throughout the pregnancy but especially in the first trimester.  She has chosen to take aspirin during the first trimester for this pregnancy. She is not having any problems today.    Hx: The following portions of the patient's history were reviewed and updated as appropriate:             She  has a past medical history of Asthma, History of miscarriage (04/20/2021), and Sjogren's syndrome (Victoria Hobbs). She does not have any pertinent problems on file. She  has a past surgical history that includes Umbilical hernia repair (2010); Tonsillectomy; Cervix surgery; and BRAVO ph study. Her family history includes Asthma in her mother; Crohn's disease in her brother; Heart attack in her maternal grandmother; Sarcoidosis in her brother; Thyroid disease in her sister. She  reports that she has never smoked. She has never used smokeless tobacco. She reports current alcohol use. She reports that she does not use drugs. She has a current medication list which includes the following prescription(s): albuterol, aspirin ec, cetirizine, cetirizine, cholecalciferol, fluticasone, hydroxychloroquine, montelukast, multivitamin-prenatal, symbicort, triamcinolone ointment, and vitamin b-12. She is allergic to metronidazole.       Review of Systems:  Review of Systems  Constitutional: Denied constitutional symptoms, night sweats, recent illness, fatigue, fever, insomnia and weight loss.  Eyes: Denied eye symptoms, eye pain, photophobia, vision change and visual disturbance.  Ears/Nose/Throat/Neck: Denied ear, nose, throat or  neck symptoms, hearing loss, nasal discharge, sinus congestion and sore throat.  Cardiovascular: Denied cardiovascular symptoms, arrhythmia, chest pain/pressure, edema, exercise intolerance, orthopnea and palpitations.  Respiratory: Denied pulmonary symptoms, asthma, pleuritic pain, productive sputum, cough, dyspnea and wheezing.  Gastrointestinal: Denied, gastro-esophageal reflux, melena, nausea and vomiting.  Genitourinary: Denied genitourinary symptoms including symptomatic vaginal discharge, pelvic relaxation issues, and urinary complaints.  Musculoskeletal: Denied musculoskeletal symptoms, stiffness, swelling, muscle weakness and myalgia.  Dermatologic: Denied dermatology symptoms, rash and scar.  Neurologic: Denied neurology symptoms, dizziness, headache, neck pain and syncope.  Psychiatric: Denied psychiatric symptoms, anxiety and depression.  Endocrine: Denied endocrine symptoms including hot flashes and night sweats.   Meds:   Current Outpatient Medications on File Prior to Visit  Medication Sig Dispense Refill   albuterol (VENTOLIN HFA) 108 (90 Base) MCG/ACT inhaler daily.     aspirin EC 81 MG tablet Take 81 mg by mouth in the morning and at bedtime. Swallow whole.     cetirizine (ZYRTEC) 10 MG tablet Take 10 mg by mouth daily.     cetirizine (ZYRTEC) 10 MG tablet      cholecalciferol (VITAMIN D3) 25 MCG (1000 UNIT) tablet Take 1,000 Units by mouth daily.     fluticasone (FLONASE) 50 MCG/ACT nasal spray in the morning and at bedtime.     hydroxychloroquine (PLAQUENIL) 200 MG tablet Take 200 mg by mouth 2 (two) times daily.     montelukast (SINGULAIR) 10 MG tablet at bedtime.     Prenatal Vit-Fe Fumarate-FA (MULTIVITAMIN-PRENATAL) 27-0.8 MG TABS tablet Take 1 tablet by mouth daily at 12 noon.     SYMBICORT 80-4.5 MCG/ACT inhaler Inhale 2 puffs into the lungs  2 (two) times daily.     triamcinolone ointment (KENALOG) 0.1 % APPLY TO AFFECTED AREA TWICE A DAY     vitamin B-12  (CYANOCOBALAMIN) 500 MCG tablet Take 500 mcg by mouth daily.     No current facility-administered medications on file prior to visit.      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 Possible pregnancy, not yet confirmed   2. History of recurrent miscarriages   3. Sjogren's syndrome without extraglandular involvement (Victoria Hobbs)     Patient likely has some form of autoimmune issue with pregnancies.  Aspirin possibly helping with this.  She is currently taking aspirin in the first trimester as it was successful with a previous pregnancy. Approximately 5 weeks estimated gestational age based on LMP   Plan:            Prenatal Plan 1.  The patient was given prenatal literature. 2.  She was continued on prenatal vitamins. 3.  A prenatal lab panel to be drawn at nurse visit. 4.  An ultrasound was ordered to better determine an EDC. 5.  A nurse visit was scheduled. 6.  Genetic testing and testing for other inheritable conditions discussed in detail. She will decide in the future whether to have these labs performed. 7.  A general overview of pregnancy testing, visit schedule, ultrasound schedule, and prenatal care was discussed.  Orders Orders Placed This Encounter  Procedures   US OB Comp Less 14 Wks   POCT urine pregnancy    No orders of the defined types were placed in this encounter.     F/U  Return  in about 7 weeks (around 01/02/2022). I spent 22 minutes involved in the care of this patient preparing to see the patient by obtaining and reviewing her medical history (including labs, imaging tests and prior procedures), documenting clinical information in the electronic health record (EHR), counseling and coordinating care plans, writing and sending prescriptions, ordering tests or procedures and in direct communicating with the patient and medical staff discussing pertinent items from her history and physical exam.  Finis Bud, M.D. 11/14/2021 10:24 AM

## 2021-11-14 NOTE — Progress Notes (Deleted)
Office Visit Note  Patient: Victoria Hobbs             Date of Birth: 1981/04/03           MRN: [id number]             PCP: Terald Sleeper, PA-C Referring: Terald Sleeper, PA-C Visit Date: 11/26/2021   Subjective:  No chief complaint on file.   History of Present Illness: Victoria Hobbs is a 41 y.o. female here for follow up ***   Previous HPI 08/26/2021 Victoria Hobbs is a 41 y.o. female here for sjogren's syndrome. She was taking hydroxychloroquine 200 mg BID for joint and organ involvement but is off this medication since a few years ago. She started suffering from chronic fatigue and joint pain in multiple areas since over 10 years ago but no underlying cause identified for a few years. She also developed dry eyes and mouth and then had discoloration of her hands started intermittently prompting lab testing due to raynaud's symptoms. She started on plaquenil that was somewhat helpful for symptoms. However she was concerned about this contributing to recurrent miscarriages so stopped taking this in 2020 and had a successful delivery. Labs previously showing low positive SSA Abs and RF. She is not sure whether tested or treatment for APS, she is taking aspirin 81 mg daily. 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 These were itching and sometimes burning discomfort but came and went periodically lasting less than 1 year in total. She has some ongoing skin rashes with eczema and with recurrent seborrheic capitis using topical triamcinolone for this. For dry eyes she uses systane drops which are helpful but expensive to use all the times she has symptoms. She was seeing ophthalmology regularly for this and for HCQ monitoring but not since about 3 years ago. She has dry mouth and has multiple cavities she drinks a lot of water and uses sugar free gum or lozenges frequently. She has a sore throat a  lot no specific trouble swallowing no problems with thrush.     No Rheumatology ROS completed.   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   Eczema 07/06/2021   Allergic rhinitis 07/06/2021    Past Medical History:  Diagnosis Date   Asthma    History of miscarriage 04/20/2021   Fourth miscarriage with 2 births out of 6   Sjogren's syndrome ([place])     Family History  Problem Relation Age of Onset   Asthma Mother    Thyroid disease Sister    Sarcoidosis Brother    Crohn's disease Brother    Heart attack Maternal Grandmother    Past Surgical History:  Procedure Laterality Date   BRAVO [place] STUDY     CERVIX SURGERY     TONSILLECTOMY     UMBILICAL HERNIA REPAIR  2010   Social History   Social History Narrative   Moved from [place] about a year ago.  Had previously been followed by cardiologist there.   Immunization History  Administered Date(s) Administered  Moderna Sars-Covid-2 Vaccination 06/22/2019, 07/20/2019     Objective: Vital Signs: There were no vitals taken for this visit.   Physical Exam   Musculoskeletal Exam: ***  CDAI Exam: CDAI Score: -- Patient Global: --; Provider Global: -- Swollen: --; Tender: -- Joint Exam 11/26/2021   No joint exam has been documented for this visit   There is currently no information documented on the homunculus. Go to the Rheumatology activity and complete the homunculus joint exam.  Investigation: No additional findings.  Imaging: No results found.  Recent Labs: Lab Results  Component Value Date   WBC 7.7 04/19/2021   HGB 11.2 (L) 04/19/2021   PLT 234  04/19/2021   NA 137 04/19/2021   K 3.7 04/19/2021   CL 105 04/19/2021   CO2 26 04/19/2021   GLUCOSE 105 (H) 04/19/2021   BUN 13 04/19/2021   CREATININE 0.84 04/19/2021   CALCIUM 9.1 04/19/2021    Speciality Comments: No specialty comments available.  Procedures:  No procedures performed Allergies: Metronidazole   Assessment / Plan:     Visit Diagnoses: No diagnosis found.  ***  Orders: No orders of the defined types were placed in this encounter.  No orders of the defined types were placed in this encounter.    Follow-Up Instructions: No follow-ups on file.   Earnestine Mealing, CMA  Note - This record has been created using Editor, commissioning.  Chart creation errors have been sought, but may not always  have been located. Such creation errors do not reflect on  the standard of medical care.

## 2021-11-14 NOTE — Progress Notes (Signed)
Patient presents today for pregnancy confirmation. UPT resulted in positive. Patient states a history of miscarriages so is nervous about this pregnancy. She has started prenatal vitamins and aspirin. Patient states no other questions or concerns.

## 2021-11-19 ENCOUNTER — Ambulatory Visit (INDEPENDENT_AMBULATORY_CARE_PROVIDER_SITE_OTHER): Payer: Medicaid Other

## 2021-11-19 DIAGNOSIS — Z3A01 Less than 8 weeks gestation of pregnancy: Secondary | ICD-10-CM | POA: Diagnosis not present

## 2021-11-19 DIAGNOSIS — Z3481 Encounter for supervision of other normal pregnancy, first trimester: Secondary | ICD-10-CM | POA: Diagnosis not present

## 2021-11-19 DIAGNOSIS — Z3200 Encounter for pregnancy test, result unknown: Secondary | ICD-10-CM

## 2021-11-20 ENCOUNTER — Other Ambulatory Visit: Payer: Medicaid Other

## 2021-11-26 ENCOUNTER — Ambulatory Visit: Payer: Medicaid Other | Admitting: Internal Medicine

## 2021-11-26 DIAGNOSIS — L210 Seborrhea capitis: Secondary | ICD-10-CM

## 2021-11-26 DIAGNOSIS — N96 Recurrent pregnancy loss: Secondary | ICD-10-CM

## 2021-11-26 DIAGNOSIS — L509 Urticaria, unspecified: Secondary | ICD-10-CM

## 2021-11-26 DIAGNOSIS — M3509 Sicca syndrome with other organ involvement: Secondary | ICD-10-CM

## 2021-11-26 DIAGNOSIS — H04123 Dry eye syndrome of bilateral lacrimal glands: Secondary | ICD-10-CM

## 2021-11-27 ENCOUNTER — Other Ambulatory Visit: Payer: Medicaid Other

## 2021-11-29 ENCOUNTER — Ambulatory Visit (INDEPENDENT_AMBULATORY_CARE_PROVIDER_SITE_OTHER): Payer: Medicaid Other

## 2021-11-29 DIAGNOSIS — O0990 Supervision of high risk pregnancy, unspecified, unspecified trimester: Secondary | ICD-10-CM | POA: Insufficient documentation

## 2021-11-29 DIAGNOSIS — O2620 Pregnancy care for patient with recurrent pregnancy loss, unspecified trimester: Secondary | ICD-10-CM

## 2021-11-29 DIAGNOSIS — Z3A00 Weeks of gestation of pregnancy not specified: Secondary | ICD-10-CM

## 2021-11-29 DIAGNOSIS — O0940 Supervision of pregnancy with grand multiparity, unspecified trimester: Secondary | ICD-10-CM

## 2021-11-29 DIAGNOSIS — O99891 Other specified diseases and conditions complicating pregnancy: Secondary | ICD-10-CM

## 2021-11-29 DIAGNOSIS — O0991 Supervision of high risk pregnancy, unspecified, first trimester: Secondary | ICD-10-CM

## 2021-11-29 DIAGNOSIS — O09529 Supervision of elderly multigravida, unspecified trimester: Secondary | ICD-10-CM

## 2021-11-29 NOTE — Progress Notes (Signed)
New OB Intake  I connected with  Victoria Hobbs on 11/29/21 at  1:15 PM EDT by telephone Video Visit and verified that I am speaking with the correct person using two identifiers. Nurse is located at [employer] and pt is located at home.   I explained I am completing New OB Intake today. We discussed her EDD of 07/14/2022 that is based on LMP of 10/07/2021. Pt is G7/P2042. I reviewed her allergies, medications, Medical/Surgical/OB history, an7d appropriate screenings. Based on history, this is a/an pregnancy complicated by sjogren syndrome and multiple miscarriages  .   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  Pt asked ab what to do for nausea; adv to eat saltine crackers before getting out of bed, eat freq bland meals, avoid spicy, greasy, or highly acidic foods and avoid foods with strong odors, take pnv at HS c a snack; ginger '250mg'$  qid or ginger tea, vitamin B6 '25mg'$  tid and one '10mg'$  unisome at HS.   Delivery Plans:  Plans to deliver at [facility]  Not sure d/t high risk preg; adv if MFM thought she needed to be delivered at [facility] then that would be set up.  Anatomy US Explained anatomy US will be around 20 weeks.   Labs Discussed  genetic screening with patient. Patient desires genetic testing to be drawn at with  NOB labs. Discussed possible labs to be drawn at new OB appointment.  COVID Vaccine Patient has had COVID vaccine.   Social Determinants of Health Food  Insecurity: Pt denies food insecurity. Transportation: Patient denies transportation needs.  First visit review I reviewed new OB appt with pt. I explained she will have ob bloodwork and pap smear/pelvic exam if indicated. Explained pt will be seen by Dr. Rubie Maid at first visit; encounter routed to appropriate provider.   Cleophas Dunker, [facility] 11/29/2021  1:38 PM  Clinical Staff Provider  Office Location  [facility] Dating    Language  English Anatomy US    Flu Vaccine  offer Genetic Screen  NIPS:   TDaP vaccine   offer Hgb A1C or  GTT Early : Third trimester :   Covid UTD except booster   LAB RESULTS   Rhogam   Blood Type --/--/B POS Performed at [facility], [address] [place], [place] [address]  [phone]   Feeding Plan breast Antibody    Contraception undecided Rubella    Circumcision yes RPR     Pediatrician  [facility] [place] [facility]     Support Person yes HIV    Prenatal Classes no Varicella     GBS  (For PCN allergy, check sensitivities)   BTL Consent  Hep C   VBAC Consent  Pap      Hgb Electro      CF      SMA

## 2021-12-03 NOTE — Progress Notes (Deleted)
Office Visit    Patient Name: Victoria Hobbs Date of Encounter: 12/03/2021  Primary Care Provider:  Terald Sleeper, PA-C Primary Cardiologist:  None Primary Electrophysiologist: None  Chief Complaint    Victoria Hobbs is a 41 y.o. female with PMH of Sjogren's Syndrome with multiple miscarriages, asthma, uterine fibroids, who presents today for review of coronary CTA results and palpitations. Past Medical History    Past Medical History:  Diagnosis Date   Asthma    History of miscarriage 04/20/2021   Fourth miscarriage with 2 births out of 6   Sjogren's syndrome [facility])    Past Surgical History:  Procedure Laterality Date   BRAVO [place] STUDY     CERVIX SURGERY     TONSILLECTOMY     UMBILICAL HERNIA REPAIR  2010    Allergies  Allergies  Allergen Reactions   Metronidazole Anaphylaxis and Hives    Other reaction(s): hives    History of Present Illness    Victoria Hobbs is a 41 year old female with the above-mentioned past medical history who presents today for review of cardiac CTA results.  She has a history of Sjgren syndrome and is followed currently by rheumatology and endocrinology.  She was seen by Dr. Ellyn Hack on 08/01/2021 with complaint of chest pain, palpitations, and shortness of breath with exertion.  The pain was described as dull pressure that was heavy and lasted for several minutes.  She states that pain occurred in December 2022 and had lasted throughout the month with heavy sensations that time that occurred with and without exertion.  She endorses the sensation of occasional skipped heartbeats but states that they are not overall worrisome.  She was sent for cardiac CTA that revealed normal coronaries with no evidence of CAD and calcium score of 0.  2D echo was ordered but however is still pending completion.    Since last being seen in the office patient reports***.  Patient denies chest pain, palpitations, dyspnea, PND, orthopnea, nausea, vomiting, dizziness,  syncope, edema, weight gain, or early satiety.     ***Notes: CTA with calcium score 0 and clear coronaries ruling out ischemia for chest pain cause.  Possibly musculoskeletal but 2D echo still pending -Palpitations or chest pain still occurring? She is on 81 mg ASA for pregnancy due to Hx of Sjgren  Home Medications    Current Outpatient Medications  Medication Sig Dispense Refill   albuterol (VENTOLIN HFA) 108 (90 Base) MCG/ACT inhaler daily.     aspirin EC 81 MG tablet Take 81 mg by mouth in the morning and at bedtime. Swallow whole.     cetirizine (ZYRTEC) 10 MG tablet Take 10 mg by mouth daily. (Patient not taking: Reported on 11/29/2021)     cetirizine (ZYRTEC) 10 MG tablet  (Patient not taking: Reported on 11/29/2021)     cholecalciferol (VITAMIN D3) 25 MCG (1000 UNIT) tablet Take 1,000 Units by mouth daily.     fluticasone (FLONASE) 50 MCG/ACT nasal spray in the morning and at bedtime. (Patient not taking: Reported on 11/29/2021)     hydroxychloroquine (PLAQUENIL) 200 MG tablet Take 200 mg by mouth 2 (two) times daily.     montelukast (SINGULAIR) 10 MG tablet at bedtime. (Patient not taking: Reported on 11/29/2021)     Prenatal Vit-Fe Fumarate-FA (MULTIVITAMIN-PRENATAL) 27-0.8 MG TABS tablet Take 1 tablet by mouth daily at 12 noon.     SYMBICORT 80-4.5 MCG/ACT inhaler Inhale 2 puffs into the lungs 2 (two) times daily.     triamcinolone  ointment (KENALOG) 0.1 % APPLY TO AFFECTED AREA TWICE A DAY     vitamin B-12 (CYANOCOBALAMIN) 500 MCG tablet Take 500 mcg by mouth daily.     No current facility-administered medications for this visit.     Review of Systems  Please see the history of present illness.    (+)*** (+)***  All other systems reviewed and are otherwise negative except as noted above.  Physical Exam    Wt Readings from Last 3 Encounters:  11/14/21 188 lb 1.6 oz (85.3 kg)  10/11/21 187 lb 9.6 oz (85.1 kg)  08/26/21 198 lb (89.8 kg)   RC:BULAG were no vitals  filed for this visit.,There is no height or weight on file to calculate BMI.  Constitutional:      Appearance: Healthy appearance. Not in distress.  Neck:     Vascular: JVD normal.  Pulmonary:     Effort: Pulmonary effort is normal.     Breath sounds: No wheezing. No rales. Diminished in the bases Cardiovascular:     Normal rate. Regular rhythm. Normal S1. Normal S2.      Murmurs: There is no murmur.  Edema:    Peripheral edema absent.  Abdominal:     Palpations: Abdomen is soft non tender. There is no hepatomegaly.  Skin:    General: Skin is warm and dry.  Neurological:     General: No focal deficit present.     Mental Status: Alert and oriented to person, place and time.     Cranial Nerves: Cranial nerves are intact.  EKG/LABS/Other Studies Reviewed    ECG personally reviewed by me today - ***  Risk Assessment/Calculations:   {Does this patient have ATRIAL FIBRILLATION?[phone]}        Lab Results  Component Value Date   WBC 7.7 04/19/2021   HGB 11.2 (L) 04/19/2021   HCT 33.4 (L) 04/19/2021   MCV 85.9 04/19/2021   PLT 234 04/19/2021   Lab Results  Component Value Date   CREATININE 0.84 04/19/2021   BUN 13 04/19/2021   NA 137 04/19/2021   K 3.7 04/19/2021   CL 105 04/19/2021   CO2 26 04/19/2021   No results found for: "ALT", "AST", "GGT", "ALKPHOS", "BILITOT" No results found for: "CHOL", "HDL", "LDLCALC", "LDLDIRECT", "TRIG", "CHOLHDL"  No results found for: "HGBA1C"  Assessment & Plan    1.  Precordial chest pain  2.  Dyspnea on exertion  3. Sjogren's Syndrome:        Disposition: Follow-up with None or APP in *** months {Are you ordering a CV Procedure (e.g. stress test, cath, DCCV, TEE, etc)?   Press F2        :[id number]}   Medication Adjustments/Labs and Tests Ordered: Current medicines are reviewed at length with the patient today.  Concerns regarding medicines are outlined above.   Signed, Mable Fill, Marissa Nestle, NP 12/03/2021, 6:36  PM Lambert

## 2021-12-05 ENCOUNTER — Encounter: Payer: Self-pay | Admitting: Nurse Practitioner

## 2021-12-05 ENCOUNTER — Ambulatory Visit: Payer: Medicaid Other | Admitting: Nurse Practitioner

## 2021-12-05 DIAGNOSIS — M3509 Sicca syndrome with other organ involvement: Secondary | ICD-10-CM

## 2021-12-05 DIAGNOSIS — R072 Precordial pain: Secondary | ICD-10-CM

## 2021-12-05 DIAGNOSIS — R0609 Other forms of dyspnea: Secondary | ICD-10-CM

## 2021-12-08 ENCOUNTER — Emergency Department: Payer: Medicaid Other

## 2021-12-08 ENCOUNTER — Emergency Department
Admission: EM | Admit: 2021-12-08 | Discharge: 2021-12-08 | Disposition: A | Discharge status: Home / Self Care | Payer: Medicaid Other | Attending: Emergency Medicine | Admitting: Emergency Medicine

## 2021-12-08 DIAGNOSIS — Z3A09 9 weeks gestation of pregnancy: Secondary | ICD-10-CM | POA: Insufficient documentation

## 2021-12-08 DIAGNOSIS — O209 Hemorrhage in early pregnancy, unspecified: Secondary | ICD-10-CM | POA: Diagnosis present

## 2021-12-08 DIAGNOSIS — J45909 Unspecified asthma, uncomplicated: Secondary | ICD-10-CM | POA: Diagnosis not present

## 2021-12-08 DIAGNOSIS — O200 Threatened abortion: Secondary | ICD-10-CM

## 2021-12-08 DIAGNOSIS — O99511 Diseases of the respiratory system complicating pregnancy, first trimester: Secondary | ICD-10-CM | POA: Diagnosis not present

## 2021-12-08 LAB — CBC
HCT: 35.3 % — ABNORMAL LOW (ref 36.0–46.0)
Hemoglobin: 11.9 g/dL — ABNORMAL LOW (ref 12.0–15.0)
MCH: 29.2 pg (ref 26.0–34.0)
MCHC: 33.7 g/dL (ref 30.0–36.0)
MCV: 86.5 fL (ref 80.0–100.0)
Platelets: 233 10*3/uL (ref 150–400)
RBC: 4.08 MIL/uL (ref 3.87–5.11)
RDW: 13.4 % (ref 11.5–15.5)
WBC: 7.7 10*3/uL (ref 4.0–10.5)
nRBC: 0 % (ref 0.0–0.2)

## 2021-12-08 LAB — BASIC METABOLIC PANEL
Anion gap: 5 (ref 5–15)
BUN: <5 mg/dL — ABNORMAL LOW (ref 6–20)
CO2: 23 mmol/L (ref 22–32)
Calcium: 9.2 mg/dL (ref 8.9–10.3)
Chloride: 107 mmol/L (ref 98–111)
Creatinine, Ser: 0.57 mg/dL (ref 0.44–1.00)
GFR, Estimated: >60 mL/min (ref >60)
Glucose, Bld: 121 mg/dL — ABNORMAL HIGH (ref 70–99)
Potassium: 3.6 mmol/L (ref 3.5–5.1)
Sodium: 135 mmol/L (ref 135–145)

## 2021-12-08 LAB — HCG, QUANTITATIVE, PREGNANCY: hCG, Beta Chain, Quant, S: 186041 m[IU]/mL — ABNORMAL HIGH (ref <5)

## 2021-12-08 NOTE — ED Triage Notes (Addendum)
Pt comes pov with vaginal spotting and cramping. Pt is about [redacted] weeks pregnant. [id number]

## 2021-12-08 NOTE — ED Provider Notes (Signed)
[facility] Provider Note    Event Date/Time   First MD Initiated Contact with Patient 12/08/21 0845     (approximate)   History   Vaginal Bleeding   HPI  Victoria Hobbs is a 41 y.o. female G7, P2 with multiple prior second trimester miscarriages presents with vaginal spotting.  He was able 24th.  Today she started having some light pink spotting.  No significant cramping.  Denies back pain fevers chills abnormal discharge no urinary symptoms.  Denies chest pain dyspnea.  Does have history of multiple second trimester miscarriages so was concerned about potential miscarriage.  [facility]    Past Medical History:  Diagnosis Date   Asthma    History of miscarriage 04/20/2021   Fourth miscarriage with 2 births out of 6   Palpitations    Precordial chest pain    a. 07/2021 Cor CTA: Ca2+ 0. Nl cors. No significant noncardiac findings.   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/01/2021   Precordial chest pain 08/01/2021   DOE (dyspnea on exertion) 08/01/2021   Sjogren syndrome with other organ involvement (HCC) 08/01/2021   Urticaria 07/06/2021   Seborrhea capitis 07/06/2021   Eczema 07/06/2021   Allergic rhinitis 07/06/2021     Physical Exam  Triage Vital Signs: ED Triage Vitals  Enc Vitals Group     BP 12/08/21 0824 140/72     Pulse Rate 12/08/21 0824 83     Resp 12/08/21 0824 18     Temp 12/08/21 0824 98.9 F (37.2 C)     Temp Source 12/08/21 0824 Oral     SpO2 12/08/21 0824 98 %     Weight 12/08/21 0824 185 lb (83.9 kg)     Height 12/08/21 0824 5\' 4"  (1.626 m)     Head Circumference --      Peak Flow --      Pain Score 12/08/21 0822 3     Pain Loc --      Pain Edu? --      Excl. in GC? --      Most recent vital signs: Vitals:   12/08/21 1015 12/08/21 1016  BP: 133/75 133/75  Pulse:  73  Resp:  16  Temp:    SpO2:  100%     General: Awake, no distress.  CV:  Good peripheral perfusion.  Resp:  Normal effort.  Abd:  No distention.  Soft nontender throughout Neuro:             Awake, Alert, Oriented x 3  Other:     ED Results / Procedures / Treatments  Labs (all labs ordered are listed, but only abnormal results are displayed) Labs Reviewed  CBC - Abnormal; Notable for the following components:      Result Value   Hemoglobin 11.9 (*)    HCT 35.3 (*)    All other components within normal limits  BASIC METABOLIC PANEL - Abnormal; Notable for the following components:   Glucose, Bld 121 (*)    BUN <5 (*)    All other components within normal limits  HCG, QUANTITATIVE, PREGNANCY - Abnormal; Notable for the following components:   hCG, Beta Chain, Quant, S 186,041 (*)    All other components within  normal limits     EKG     RADIOLOGY First trimester pregnancy ultrasound interpreted by myself shows an intrauterine pregnancy   PROCEDURES:  Critical Care performed: No  Procedures  The patient is on the cardiac monitor to evaluate for evidence of arrhythmia and/or significant heart rate changes.   MEDICATIONS ORDERED IN ED: Medications - No data to display   IMPRESSION / MDM / ASSESSMENT AND PLAN / ED COURSE  I reviewed the triage vital signs and the nursing notes.                              Patient's presentation is most consistent with acute complicated illness / injury requiring diagnostic workup.  Differential diagnosis includes, but is not limited to, threatened miscarriage, subchorionic hemorrhage, cervicitis  Patient is a 41 year old female multiple prior miscarriages who presents with vaginal spotting.  She is in the first menstrual of pregnancy LMP April 24.  This was the first day of some light spotting no significant hemorrhage no  passage of clots denies abdominal pain cramping fevers chills or urinary symptoms.  Her vitals are within normal limits.  I reviewed prior lab work and she is a positive.  Been stable at 11.9 beta-hCG 180 600,000.  Abdomen is benign.  Obtained first trimester ultrasound which shows an intrauterine pregnancy approximately 9 weeks with normal fetal heart rate.  Discussed findings with the patient.  Discussed threatened miscarriage and reasons to return to the ED.  Advised routine OB/GYN follow-up.  She is appropriate for discharge.       FINAL CLINICAL IMPRESSION(S) / ED DIAGNOSES   Final diagnoses:  Threatened miscarriage     Rx / DC Orders   ED Discharge Orders     None        Note:  This document was prepared using Dragon voice recognition software and may include unintentional dictation errors.   Georga Hacking, MD 12/08/21 [phone]

## 2021-12-09 ENCOUNTER — Telehealth: Payer: Self-pay | Admitting: Obstetrics and Gynecology

## 2021-12-09 NOTE — Telephone Encounter (Signed)
Spoke with patient. Advised her to keep her appointment for 7/11 and in the interim watch for any bleeding or concerns. Patient voiced understanding and then asked about a MFM referral. She states in previous pregnancies she has been high risk and she does not want to waste any time in she needs to be transferred. Made patient aware I would pass the message along and let her know any next steps. Please advise

## 2021-12-24 ENCOUNTER — Ambulatory Visit (INDEPENDENT_AMBULATORY_CARE_PROVIDER_SITE_OTHER): Payer: Medicaid Other | Admitting: Obstetrics and Gynecology

## 2021-12-24 ENCOUNTER — Other Ambulatory Visit: Payer: Medicaid Other

## 2021-12-24 ENCOUNTER — Encounter: Payer: Self-pay | Admitting: Obstetrics and Gynecology

## 2021-12-24 VITALS — BP 109/70 | HR 87 | Wt 191.0 lb

## 2021-12-24 DIAGNOSIS — O0991 Supervision of high risk pregnancy, unspecified, first trimester: Secondary | ICD-10-CM

## 2021-12-24 DIAGNOSIS — N96 Recurrent pregnancy loss: Secondary | ICD-10-CM

## 2021-12-24 DIAGNOSIS — Z1379 Encounter for other screening for genetic and chromosomal anomalies: Secondary | ICD-10-CM

## 2021-12-24 DIAGNOSIS — Z3A11 11 weeks gestation of pregnancy: Secondary | ICD-10-CM

## 2021-12-24 DIAGNOSIS — M3500 Sicca syndrome, unspecified: Secondary | ICD-10-CM

## 2021-12-24 DIAGNOSIS — O209 Hemorrhage in early pregnancy, unspecified: Secondary | ICD-10-CM

## 2021-12-24 LAB — POCT URINALYSIS DIPSTICK OB
Bilirubin, UA: NEGATIVE
Glucose, UA: NEGATIVE
Ketones, UA: NEGATIVE
Leukocytes, UA: NEGATIVE
Nitrite, UA: NEGATIVE
Spec Grav, UA: >=1.03 — AB (ref 1.010–1.025)
Urobilinogen, UA: 0.2 E.U./dL
pH, UA: 6 (ref 5.0–8.0)

## 2021-12-24 LAB — OB RESULTS CONSOLE VARICELLA ZOSTER ANTIBODY, IGG: Varicella: IMMUNE

## 2021-12-24 NOTE — Progress Notes (Signed)
ROB. Patient states no bleeding since recent hospital visit. She states she has been aware of her fibroids for a while, unsure if that was the cause of this. Patient also would like to discuss a specialist referral due to her autoimmune disease. Patient states no questions or concerns at this time.  MAT21 ordered today, completing NOB labwork today.

## 2021-12-24 NOTE — Progress Notes (Signed)
ROB: She presents today to discuss bleeding in early pregnancy.  The bleeding has resolved.  An ultrasound previously showed a 9-week intrauterine pregnancy without issue.  She has 2 small uterine fibroids present. She specifically wanted to discuss Sjogren's syndrome today.  She is concerned that she will be transferred to a different hospital during labor.  I think this is unlikely.  She reports her last birth was relatively uncomplicated.  I have discussed with her that there is a small possibility of heart issues with the baby of mothers of Sjogren syndrome.  Plan MFM consult approximately 18 weeks with follow-up fetal echo at approximately 22 weeks.

## 2021-12-25 ENCOUNTER — Other Ambulatory Visit: Payer: Self-pay | Admitting: Obstetrics and Gynecology

## 2021-12-25 LAB — CBC/D/PLT+RPR+RH+ABO+RUBIGG...
Antibody Screen: NEGATIVE
Basophils Absolute: 0 10*3/uL (ref 0.0–0.2)
Basos: 0 %
EOS (ABSOLUTE): 0 10*3/uL (ref 0.0–0.4)
Eos: 1 %
HCV Ab: NONREACTIVE
HIV Screen 4th Generation wRfx: NONREACTIVE
Hematocrit: 33.4 % — ABNORMAL LOW (ref 34.0–46.6)
Hemoglobin: 11.4 g/dL (ref 11.1–15.9)
Hepatitis B Surface Ag: NEGATIVE
Immature Grans (Abs): 0 10*3/uL (ref 0.0–0.1)
Immature Granulocytes: 0 %
Lymphocytes Absolute: 1.4 10*3/uL (ref 0.7–3.1)
Lymphs: 22 %
MCH: 29.5 pg (ref 26.6–33.0)
MCHC: 34.1 g/dL (ref 31.5–35.7)
MCV: 86 fL (ref 79–97)
Monocytes Absolute: 0.4 10*3/uL (ref 0.1–0.9)
Monocytes: 6 %
Neutrophils Absolute: 4.5 10*3/uL (ref 1.4–7.0)
Neutrophils: 71 %
Platelets: 215 10*3/uL (ref 150–450)
RBC: 3.87 x10E6/uL (ref 3.77–5.28)
RDW: 13.8 % (ref 11.7–15.4)
RPR Ser Ql: NONREACTIVE
Rh Factor: POSITIVE
Rubella Antibodies, IGG: 4.67 index (ref >0.99)
Varicella zoster IgG: 2617 index (ref >165)
WBC: 6.3 10*3/uL (ref 3.4–10.8)

## 2021-12-25 LAB — HCV INTERPRETATION

## 2021-12-26 ENCOUNTER — Other Ambulatory Visit: Payer: Self-pay | Admitting: Obstetrics and Gynecology

## 2021-12-26 DIAGNOSIS — O0991 Supervision of high risk pregnancy, unspecified, first trimester: Secondary | ICD-10-CM

## 2021-12-29 LAB — MATERNIT 21 PLUS CORE, BLOOD
Fetal Fraction: 5
Result (T21): NEGATIVE
Trisomy 13 (Patau syndrome): NEGATIVE
Trisomy 18 (Edwards syndrome): NEGATIVE
Trisomy 21 (Down syndrome): NEGATIVE

## 2022-01-02 ENCOUNTER — Other Ambulatory Visit (HOSPITAL_COMMUNITY)
Admission: RE | Admit: 2022-01-02 | Discharge: 2022-01-02 | Disposition: A | Discharge status: Home / Self Care | Payer: Medicaid Other | Source: Ambulatory Visit | Attending: Obstetrics and Gynecology | Admitting: Obstetrics and Gynecology

## 2022-01-02 ENCOUNTER — Ambulatory Visit (INDEPENDENT_AMBULATORY_CARE_PROVIDER_SITE_OTHER): Payer: Medicaid Other | Admitting: Obstetrics and Gynecology

## 2022-01-02 ENCOUNTER — Encounter: Payer: Self-pay | Admitting: Obstetrics and Gynecology

## 2022-01-02 VITALS — BP 115/79 | HR 91 | Wt 191.1 lb

## 2022-01-02 DIAGNOSIS — Z124 Encounter for screening for malignant neoplasm of cervix: Secondary | ICD-10-CM | POA: Diagnosis present

## 2022-01-02 DIAGNOSIS — Z3A12 12 weeks gestation of pregnancy: Secondary | ICD-10-CM

## 2022-01-02 DIAGNOSIS — O0991 Supervision of high risk pregnancy, unspecified, first trimester: Secondary | ICD-10-CM

## 2022-01-02 LAB — POCT URINALYSIS DIPSTICK OB
Bilirubin, UA: NEGATIVE
Blood, UA: NEGATIVE
Glucose, UA: NEGATIVE
Ketones, UA: NEGATIVE
Leukocytes, UA: NEGATIVE
Nitrite, UA: NEGATIVE
Spec Grav, UA: >=1.03 — AB (ref 1.010–1.025)
Urobilinogen, UA: 0.2 E.U./dL
pH, UA: 6.5 (ref 5.0–8.0)

## 2022-01-02 NOTE — Progress Notes (Signed)
NOB: Reports that she feels well.  Says that she has never felt this good during the pregnancy before.  She is currently taking 2 ASA.  MaterniT 21-normal (does not want to know the sex).  Pap performed.  aFP next visit.  MFM consult approximately 18 weeks for Sjogren syndrome.  Physical examination General NAD, Conversant  HEENT Atraumatic; Op clear with mmm.  Normo-cephalic. Pupils reactive. Anicteric sclerae  Thyroid/Neck Smooth without nodularity or enlargement. Normal ROM.  Neck Supple.  Skin No rashes, lesions or ulceration. Normal palpated skin turgor. No nodularity.  Breasts: No masses or discharge.  Symmetric.  No axillary adenopathy.  Lungs: Clear to auscultation.No rales or wheezes. Normal Respiratory effort, no retractions.  Heart: NSR.  No murmurs or rubs appreciated. No periferal edema  Abdomen: Soft.  Non-tender.  No masses.  No HSM. No hernia  Extremities: Moves all appropriately.  Normal ROM for age. No lymphadenopathy.  Neuro: Oriented to PPT.  Normal mood. Normal affect.     Pelvic:   Vulva: Normal appearance.  No lesions.  Vagina: No lesions or abnormalities noted.  Support: Normal pelvic support.  Urethra No masses tenderness or scarring.  Meatus Normal size without lesions or prolapse.  Cervix: Normal appearance.  No lesions.  Friable  Anus: Normal exam.  No lesions.  Perineum: Normal exam.  No lesions.        Bimanual   Adnexae: No masses.  Non-tender to palpation.  Uterus: Enlarged. 12wks 155 bpm non-tender.  Mobile.  AV.  Adnexae: No masses.  Non-tender to palpation.  Cul-de-sac: Negative for abnormality.  Adnexae: No masses.  Non-tender to palpation.         Pelvimetry   Diagonal: Reached.  Spines: Average.  Sacrum: Concave.  Pubic Arch: Normal.

## 2022-01-02 NOTE — Progress Notes (Signed)
Patient presents today for New OB physical. She states no bleeding since last visit and is feeling good about this pregnancy. Patient is due for her pap smear. Patient has completed genetic  testing already, MAT21 was negative. Patient states no other questions or concerns at this time.

## 2022-01-03 LAB — CYTOLOGY - PAP
Comment: NEGATIVE
Diagnosis: NEGATIVE
High risk HPV: NEGATIVE

## 2022-01-29 ENCOUNTER — Encounter: Payer: Self-pay | Admitting: Obstetrics and Gynecology

## 2022-01-29 ENCOUNTER — Ambulatory Visit (INDEPENDENT_AMBULATORY_CARE_PROVIDER_SITE_OTHER): Payer: Medicaid Other | Admitting: Obstetrics and Gynecology

## 2022-01-29 VITALS — BP 114/65 | HR 83 | Wt 193.8 lb

## 2022-01-29 DIAGNOSIS — O0990 Supervision of high risk pregnancy, unspecified, unspecified trimester: Secondary | ICD-10-CM

## 2022-01-29 DIAGNOSIS — D259 Leiomyoma of uterus, unspecified: Secondary | ICD-10-CM

## 2022-01-29 DIAGNOSIS — N96 Recurrent pregnancy loss: Secondary | ICD-10-CM

## 2022-01-29 DIAGNOSIS — Z1379 Encounter for other screening for genetic and chromosomal anomalies: Secondary | ICD-10-CM

## 2022-01-29 DIAGNOSIS — Z3A16 16 weeks gestation of pregnancy: Secondary | ICD-10-CM

## 2022-01-29 DIAGNOSIS — M3500 Sicca syndrome, unspecified: Secondary | ICD-10-CM

## 2022-01-29 HISTORY — DX: Leiomyoma of uterus, unspecified: D25.9

## 2022-01-29 LAB — POCT URINALYSIS DIPSTICK OB
Bilirubin, UA: NEGATIVE
Blood, UA: NEGATIVE
Glucose, UA: NEGATIVE
Ketones, UA: NEGATIVE
Nitrite, UA: NEGATIVE
Spec Grav, UA: 1.015 (ref 1.010–1.025)
Urobilinogen, UA: 0.2 E.U./dL
pH, UA: 5 (ref 5.0–8.0)

## 2022-01-29 NOTE — Progress Notes (Signed)
ROB: She is doing well, feeling more sleepy than usual. No new concerns. Discussed AFP today, ok to perform. Normal MaterniT21. Does not desire to know gender of fetus for entire pregnancy. Referral placed for MFM for anatomy scan and consultation for Sjogren's syndrome. RTC in 4 weeks.

## 2022-01-29 NOTE — Progress Notes (Signed)
ROB- no concerns 

## 2022-02-02 LAB — AFP, SERUM, OPEN SPINA BIFIDA
AFP MoM: 0.72
AFP Value: 24.3 ng/mL
Gest. Age on Collection Date: 16.3 weeks
Maternal Age At EDD: 40 yr
OSBR Risk 1 IN: 10000
Test Results:: NEGATIVE
Weight: 193 [lb_av]

## 2022-02-25 ENCOUNTER — Ambulatory Visit (INDEPENDENT_AMBULATORY_CARE_PROVIDER_SITE_OTHER): Payer: Medicaid Other | Admitting: Obstetrics and Gynecology

## 2022-02-25 ENCOUNTER — Encounter: Payer: Self-pay | Admitting: Obstetrics and Gynecology

## 2022-02-25 VITALS — BP 109/73 | HR 86 | Wt 192.5 lb

## 2022-02-25 DIAGNOSIS — Z3A20 20 weeks gestation of pregnancy: Secondary | ICD-10-CM

## 2022-02-25 DIAGNOSIS — M3500 Sicca syndrome, unspecified: Secondary | ICD-10-CM

## 2022-02-25 DIAGNOSIS — O09892 Supervision of other high risk pregnancies, second trimester: Secondary | ICD-10-CM

## 2022-02-25 LAB — POCT URINALYSIS DIPSTICK OB
Bilirubin, UA: NEGATIVE
Blood, UA: NEGATIVE
Glucose, UA: NEGATIVE
Ketones, UA: NEGATIVE
Leukocytes, UA: NEGATIVE
Nitrite, UA: NEGATIVE
POC,PROTEIN,UA: NEGATIVE
Spec Grav, UA: 1.015 (ref 1.010–1.025)
Urobilinogen, UA: 0.2 E.U./dL
pH, UA: 6.5 (ref 5.0–8.0)

## 2022-02-25 NOTE — Progress Notes (Signed)
ROB: Doing well without complaints.  Taking aspirin as directed.  MFM appointment in 2 weeks.

## 2022-02-25 NOTE — Progress Notes (Signed)
ROB. Patient states daily fetal movement with no pain or pressure. She states no questions or concerns at this time.   

## 2022-03-06 ENCOUNTER — Other Ambulatory Visit: Payer: Self-pay

## 2022-03-06 DIAGNOSIS — O09522 Supervision of elderly multigravida, second trimester: Secondary | ICD-10-CM

## 2022-03-06 DIAGNOSIS — D259 Leiomyoma of uterus, unspecified: Secondary | ICD-10-CM

## 2022-03-06 DIAGNOSIS — M3509 Sicca syndrome with other organ involvement: Secondary | ICD-10-CM

## 2022-03-06 DIAGNOSIS — Z8759 Personal history of other complications of pregnancy, childbirth and the puerperium: Secondary | ICD-10-CM

## 2022-03-09 ENCOUNTER — Other Ambulatory Visit: Payer: Self-pay

## 2022-03-09 ENCOUNTER — Encounter: Payer: Self-pay | Admitting: Obstetrics and Gynecology

## 2022-03-09 ENCOUNTER — Observation Stay
Admission: EM | Admit: 2022-03-09 | Discharge: 2022-03-09 | Disposition: A | Discharge status: Home / Self Care | Payer: Medicaid Other | Attending: Certified Nurse Midwife | Admitting: Certified Nurse Midwife

## 2022-03-09 DIAGNOSIS — Z3A21 21 weeks gestation of pregnancy: Secondary | ICD-10-CM | POA: Diagnosis not present

## 2022-03-09 DIAGNOSIS — Z79899 Other long term (current) drug therapy: Secondary | ICD-10-CM | POA: Insufficient documentation

## 2022-03-09 DIAGNOSIS — Z7982 Long term (current) use of aspirin: Secondary | ICD-10-CM | POA: Insufficient documentation

## 2022-03-09 DIAGNOSIS — O26892 Other specified pregnancy related conditions, second trimester: Principal | ICD-10-CM | POA: Insufficient documentation

## 2022-03-09 DIAGNOSIS — O09522 Supervision of elderly multigravida, second trimester: Secondary | ICD-10-CM | POA: Insufficient documentation

## 2022-03-09 DIAGNOSIS — O0990 Supervision of high risk pregnancy, unspecified, unspecified trimester: Secondary | ICD-10-CM

## 2022-03-09 DIAGNOSIS — Z7951 Long term (current) use of inhaled steroids: Secondary | ICD-10-CM | POA: Diagnosis not present

## 2022-03-09 DIAGNOSIS — R109 Unspecified abdominal pain: Secondary | ICD-10-CM | POA: Insufficient documentation

## 2022-03-09 LAB — URINALYSIS, ROUTINE W REFLEX MICROSCOPIC
Bilirubin Urine: NEGATIVE
Glucose, UA: NEGATIVE mg/dL
Hgb urine dipstick: NEGATIVE
Ketones, ur: NEGATIVE mg/dL
Nitrite: NEGATIVE
Protein, ur: NEGATIVE mg/dL
Specific Gravity, Urine: 1.015 (ref 1.005–1.030)
pH: 7.5 (ref 5.0–8.0)

## 2022-03-09 LAB — URINALYSIS, MICROSCOPIC (REFLEX)

## 2022-03-09 MED ORDER — ACETAMINOPHEN 500 MG PO TABS
1000.0000 mg | ORAL_TABLET | ORAL | Status: DC | PRN
  Administered 2022-03-09: 1000 mg via ORAL
  Filled 2022-03-09: qty 2

## 2022-03-09 NOTE — OB Triage Note (Signed)
    L&D OB Triage Note  SUBJECTIVE Victoria Hobbs is a 41 y.o. [id number] female at [redacted]w[redacted]d EDaytonEstimated Date of Delivery: 07/14/22 who presented to triage with complaints of cramping for the past few days 2/10 on pain scale. She has not taken any tylenol. She state she has recently been moving. She denies LOF, vaginal bleeding , and is feeling good movement.    OB History  Gravida Para Term Preterm AB Living  '7 2 2 '$ 0 4 2  SAB IAB Ectopic Multiple Live Births  4 0 0 0 2    # Outcome Date GA Lbr Len/2nd Weight Sex Delivery Anes PTL Lv  7 Current           6 SAB 2022          5 SAB 2022          4 Term 2021     Vag-Spont   LIV  3 SAB 2020          2 Term 2008     Vag-Spont   LIV  1 SAB 2007            Medications Prior to Admission  Medication Sig Dispense Refill Last Dose   albuterol (VENTOLIN HFA) 108 (90 Base) MCG/ACT inhaler daily.   Past Month   aspirin EC 81 MG tablet Take 81 mg by mouth in the morning and at bedtime. Swallow whole.   03/08/2022   hydroxychloroquine (PLAQUENIL) 200 MG tablet Take 200 mg by mouth 2 (two) times daily.   03/08/2022   Prenatal Vit-Fe Fumarate-FA (MULTIVITAMIN-PRENATAL) 27-0.8 MG TABS tablet Take 1 tablet by mouth daily at 12 noon.   03/08/2022   SYMBICORT 80-4.5 MCG/ACT inhaler Inhale 2 puffs into the lungs 2 (two) times daily.   Past Month   cholecalciferol (VITAMIN D3) 25 MCG (1000 UNIT) tablet Take 1,000 Units by mouth daily. (Patient not taking: Reported on 03/09/2022)   Not Taking     OBJECTIVE  Nursing Evaluation:   BP 122/66 (BP Location: Left Arm)   Pulse 89   Temp 98.4 F (36.9 C) (Oral)   Resp 16   Ht '5\' 4"'$  (1.626 m)   Wt 87.3 kg   LMP 10/07/2021 (Exact Date)   BMI 33.04 kg/m    Findings:   musculoskeletal discomfort       NST was performed and has been reviewed by me.  NST INTERPRETATION: Category I  Mode: External Baseline Rate (A): 150 bpm, FHT only, NST not appropriate for gestational age    Contraction Frequency (min):  none  ASSESSMENT Impression:  1.  Pregnancy:  [id number] EDD Estimated Date of Delivery: 07/14/22 2.  Reassuring fetal and maternal status 3.  Pain improved with tylenol   PLAN 1. Current condition and above findings reviewed.  Reassuring fetal and maternal condition. 2. Discharge home with standard labor precautions given to return to L&D or call the office for problems. 3. Continue routine prenatal care.     AnPhilip AspenCNM

## 2022-03-09 NOTE — OB Triage Note (Signed)
Pt discharge home. Left floor ambulatory by self. Victoria Hobbs

## 2022-03-09 NOTE — OB Triage Note (Signed)
Pt co cramping x 4 days. Denies vaginal bleeding, change in vaginal discharge, LOF. Reports + fetal movement. Victoria Hobbs

## 2022-03-13 ENCOUNTER — Other Ambulatory Visit: Payer: Self-pay | Admitting: Obstetrics and Gynecology

## 2022-03-13 ENCOUNTER — Ambulatory Visit (HOSPITAL_BASED_OUTPATIENT_CLINIC_OR_DEPARTMENT_OTHER): Payer: Medicaid Other | Admitting: Obstetrics and Gynecology

## 2022-03-13 ENCOUNTER — Ambulatory Visit: Payer: Medicaid Other | Attending: Obstetrics and Gynecology

## 2022-03-13 ENCOUNTER — Other Ambulatory Visit: Payer: Self-pay

## 2022-03-13 DIAGNOSIS — M3500 Sicca syndrome, unspecified: Secondary | ICD-10-CM | POA: Diagnosis not present

## 2022-03-13 DIAGNOSIS — O0990 Supervision of high risk pregnancy, unspecified, unspecified trimester: Secondary | ICD-10-CM

## 2022-03-13 DIAGNOSIS — O2692 Pregnancy related conditions, unspecified, second trimester: Secondary | ICD-10-CM

## 2022-03-13 DIAGNOSIS — O09522 Supervision of elderly multigravida, second trimester: Secondary | ICD-10-CM

## 2022-03-13 DIAGNOSIS — O99891 Other specified diseases and conditions complicating pregnancy: Secondary | ICD-10-CM

## 2022-03-13 DIAGNOSIS — E669 Obesity, unspecified: Secondary | ICD-10-CM

## 2022-03-13 DIAGNOSIS — O99212 Obesity complicating pregnancy, second trimester: Secondary | ICD-10-CM

## 2022-03-13 DIAGNOSIS — O2622 Pregnancy care for patient with recurrent pregnancy loss, second trimester: Secondary | ICD-10-CM

## 2022-03-13 DIAGNOSIS — Z368A Encounter for antenatal screening for other genetic defects: Secondary | ICD-10-CM | POA: Insufficient documentation

## 2022-03-13 DIAGNOSIS — Q8719 Other congenital malformation syndromes predominantly associated with short stature: Secondary | ICD-10-CM

## 2022-03-13 DIAGNOSIS — Z3A22 22 weeks gestation of pregnancy: Secondary | ICD-10-CM

## 2022-03-13 DIAGNOSIS — O2693 Pregnancy related conditions, unspecified, third trimester: Secondary | ICD-10-CM | POA: Insufficient documentation

## 2022-03-13 DIAGNOSIS — O09292 Supervision of pregnancy with other poor reproductive or obstetric history, second trimester: Secondary | ICD-10-CM | POA: Diagnosis not present

## 2022-03-13 NOTE — Progress Notes (Signed)
Maternal-Fetal Medicine   Name: Victoria Hobbs DOB: 1981/06/13 MRN: [id number] Referring Provider: Jeannie Fend, MD  I had the pleasure of seeing [name] today at [facility].  She is G7 P2042 at 22w 3d gestation and is here for fetal anatomy scan and consultation.  Past medical history significant for Sjogren's syndrome diagnosed in 2008 after the birth of her second child.  Patient reports her flares usually consist of generalized body rashes, hair loss, and joint pain and swelling.  Since 2021 after delivery of her second child, she has not had any flares and the disease is in quiescent stage.  She reports having dry eyes and uses moisturizer.  Patient takes hydroxychloroquine 200 mg twice daily.  She does not have hypertension or diabetes or chronic kidney disease or any other chronic medical conditions.  Past surgical history: Umbilical hernia repair, tonsillectomy, Bravo pH study. Allergies: Flagyl (rashes). Medications: Prenatal vitamins, aspirin 162 mg daily, hydroxychloroquine 200 mg twice daily. Social history: Denies tobacco or drug or alcohol use.  She been married 3 years and her husband is in good health he is a father of her second child.  Both patient and her husband do not have sickle cell trait. Family history: No history of venous thromboembolism in the family.  GYN history: History of abnormal Pap smear and LEEP surgery in 2009.  Obstetric history -2008: Term vaginal delivery of a female infant in [place] state weighing 7 pounds 11 ounces at birth.  Her daughter is in good health. -2021: Term vaginal delivery in [place] state of a female infant weighing 7 pounds and 13 ounces at birth.  Her pregnancy was complicated by gestational diabetes.  Her daughter is in good health.  Prenatal course: On cell-free fetal DNA screening, the risks of fetal aneuploidies are not increased.  MSAFP screening showed low risk for open neural tube defects.  Ultrasound We  performed a fetal anatomical survey.  Amniotic fluid is normal and good fetal activity seen.  Fetal biometry is consistent with the previously established dates.  No markers of aneuploidy's or fetal structural defects are seen.  Minimal pericardial effusion is seen.  Fetal heart rate and rhythm appear normal.  PR interval appears normal (117 milliseconds).  I reassured the patient that pericardial effusion is usually transitory in the absence of structural heart defects and should resolve.  Sjogren syndrome (SS) Patient has primary Sjogren's syndrome studies regarding pregnancy outcomes have not been consistent.  Overall, an increased risk of miscarriage, preterm birth, fetal growth restriction, stillbirth and low birth weights were reported. A slight increase in congenital malformations was reported in some studies In the absence of active disease, we should expect good pregnancy outcomes.  I reassured the patient that hydroxychloroquine can be safely taken in pregnancy.  It reduces the likelihood of developing congenital heart block.  Anti-SSA (Ro) antibodies: Patient has increased anti-SSA (Ro) antibodies, but anti-SSB antibodies (La) are not increased. The presence of these antibodies can result in more commonly in cutaneous manifestations of neonatal lupus syndrome and less commonly cardiac manifestations including congenital heart block.  Neonatal cutaneous manifestations include scaling, plaque formation and hypopigmentation all of which usually resolve over time. Maternal antibodies are IgG Ro antibodies reactive to Ro and La antigens.    In pregnancy transplacental passage of these antibodies can react with fetal cardiomyocytes and cause inflammation.  Depending on the genetic predisposition and the susceptibility in the fetus, inflammatory reaction can lead to fibrosis and defective conduction mechanism.  Initially,  they manifest as first degree heart block leading to second or third degree  heart block during the course of pregnancy.  Women with both Ro and La antibodies are slightly more likely to develop congenital heart block.  Overall risk is estimated to be less than 5% and there is a higher incidence of affected fetus in women who suffer from hypothyroidism and having these antibodies.    Serial monitoring (weekly) to identify prolonged PR interval (so that treatment with steroids could be initiated) has not been found to be useful.  There is no accepted treatment for this condition and maternal dexamethasone treatment is of doubtful value.  Women with previously affected infant are more likely to develop atrioventricular block (15-20%).  I counseled the patient on the possible neonatal manifestations.  I also reassured her that only a very small percentage of babies are likely to be affected.  Recommendations -An appointment was made for her to return in 4 weeks for fetal growth assessment. -We have requested an appointment for fetal echocardiography ([facility]). -Fetal growth assessments every 4 weeks till delivery. -Weekly BPP from [redacted] weeks gestation till delivery.  Thank you for consultation.  If you have any questions or concerns, please contact me the [facility].  Consultation including face-to-face counseling (more than 50% of time spent) is 30 minutes.

## 2022-03-25 ENCOUNTER — Ambulatory Visit (INDEPENDENT_AMBULATORY_CARE_PROVIDER_SITE_OTHER): Payer: Medicaid Other | Admitting: Obstetrics and Gynecology

## 2022-03-25 ENCOUNTER — Encounter: Payer: Self-pay | Admitting: Obstetrics and Gynecology

## 2022-03-25 VITALS — BP 111/68 | HR 90 | Wt 200.4 lb

## 2022-03-25 DIAGNOSIS — O09299 Supervision of pregnancy with other poor reproductive or obstetric history, unspecified trimester: Secondary | ICD-10-CM | POA: Insufficient documentation

## 2022-03-25 DIAGNOSIS — Z130 Encounter for screening for diseases of the blood and blood-forming organs and certain disorders involving the immune mechanism: Secondary | ICD-10-CM

## 2022-03-25 DIAGNOSIS — O09892 Supervision of other high risk pregnancies, second trimester: Secondary | ICD-10-CM

## 2022-03-25 DIAGNOSIS — Z113 Encounter for screening for infections with a predominantly sexual mode of transmission: Secondary | ICD-10-CM

## 2022-03-25 DIAGNOSIS — Z3A24 24 weeks gestation of pregnancy: Secondary | ICD-10-CM

## 2022-03-25 DIAGNOSIS — Z23 Encounter for immunization: Secondary | ICD-10-CM

## 2022-03-25 DIAGNOSIS — M3500 Sicca syndrome, unspecified: Secondary | ICD-10-CM

## 2022-03-25 HISTORY — DX: Supervision of pregnancy with other poor reproductive or obstetric history, unspecified trimester: O09.299

## 2022-03-25 LAB — POCT URINALYSIS DIPSTICK OB
Bilirubin, UA: NEGATIVE
Blood, UA: NEGATIVE
Glucose, UA: NEGATIVE
Ketones, UA: NEGATIVE
Leukocytes, UA: NEGATIVE
Nitrite, UA: NEGATIVE
POC,PROTEIN,UA: NEGATIVE
Spec Grav, UA: 1.01 (ref 1.010–1.025)
Urobilinogen, UA: 0.2 E.U./dL
pH, UA: 7 (ref 5.0–8.0)

## 2022-03-25 NOTE — Patient Instructions (Signed)
      Doula Services at [facility]  Please email [place] at: [email or web address]. We do have a phone number, but it is a voicemail only, [phone]. We can then call the client back and collect info from them to ensure they qualify for no-cost doula services and match them with a doula.   You may also complete a Request Form online.  Please view the Doula Request Form ( [email or web address]) and submit it if you meet the qualifying criteria listed within the form. If you do not meet the criteria for the program to receive a doula at no cost, we have attached a list of private doulas who are credentialed with [employer] that you can connect with and retain for your birth.   Currently, our program is only for birth, but most of our doulas connect with clients and provide support in the prenatal period as well.

## 2022-03-25 NOTE — Progress Notes (Signed)
ROB: Patient doing ok, no major issues.  Does not feeling more tired lately.  After further discussion, patient notes that she recently moved, and her toddler has begun sleeping in the bed with her and her husband. Likely not getting restful sleep. Discussed home measures, also advised on natural supplements to boost energy levels during the day. Has questions regarding Tdap. Ok, to receive today. Will think about flu vaccine next time (initially declined but after further discussion may consider).  Completed fetal echo. For 28 week labs next visit. Reports h/o GDM in last pregnancy, diet controlled.

## 2022-04-09 ENCOUNTER — Inpatient Hospital Stay (HOSPITAL_COMMUNITY)
Admission: AD | Admit: 2022-04-09 | Discharge: 2022-04-09 | Disposition: A | Discharge status: Home / Self Care | Payer: Medicaid Other | Attending: Obstetrics & Gynecology | Admitting: Obstetrics & Gynecology

## 2022-04-09 ENCOUNTER — Encounter (HOSPITAL_COMMUNITY): Payer: Self-pay | Admitting: Obstetrics & Gynecology

## 2022-04-09 ENCOUNTER — Telehealth: Payer: Self-pay

## 2022-04-09 DIAGNOSIS — Z3A26 26 weeks gestation of pregnancy: Secondary | ICD-10-CM | POA: Diagnosis not present

## 2022-04-09 DIAGNOSIS — O26892 Other specified pregnancy related conditions, second trimester: Secondary | ICD-10-CM | POA: Insufficient documentation

## 2022-04-09 DIAGNOSIS — R102 Pelvic and perineal pain: Secondary | ICD-10-CM | POA: Insufficient documentation

## 2022-04-09 DIAGNOSIS — M3500 Sicca syndrome, unspecified: Secondary | ICD-10-CM | POA: Insufficient documentation

## 2022-04-09 DIAGNOSIS — B3731 Acute candidiasis of vulva and vagina: Secondary | ICD-10-CM

## 2022-04-09 LAB — URINALYSIS, ROUTINE W REFLEX MICROSCOPIC
Bilirubin Urine: NEGATIVE
Glucose, UA: NEGATIVE mg/dL
Hgb urine dipstick: NEGATIVE
Ketones, ur: NEGATIVE mg/dL
Nitrite: NEGATIVE
Protein, ur: NEGATIVE mg/dL
Specific Gravity, Urine: 1.009 (ref 1.005–1.030)
pH: 7 (ref 5.0–8.0)

## 2022-04-09 LAB — WET PREP, GENITAL
Clue Cells Wet Prep HPF POC: NONE SEEN
Sperm: NONE SEEN
Trich, Wet Prep: NONE SEEN
WBC, Wet Prep HPF POC: >=10 — AB (ref <10)
Yeast Wet Prep HPF POC: NONE SEEN

## 2022-04-09 MED ORDER — TERCONAZOLE 0.8 % VA CREA: 1.0000 | TOPICAL_CREAM | Freq: Every day | VAGINAL | 0 refills | Status: AC

## 2022-04-09 NOTE — MAU Provider Note (Addendum)
History     CSN: [id number]  Arrival date and time: 04/09/22 1648   None     Chief Complaint  Patient presents with   Pelvic Pain   Vaginal Discharge   HPI  41 yo [id number] at 23w2dhere with concern about losing mucus plug and vaginal pain. Describes having large amount of mucus-like vaginal discharge around 10 days ago, and then again yesterday. For the past day has had vague vaginal pain, worse with walking. No dysuria or increased urinary frequency. Has had some No vaginal bleeding, LOF. Baby is moving normally, she reports some decreased movement during the day which is typical for this pregnancy, more movement at night.  This pregnancy has been uncomplicated. Sjogren's syndrome has been well controlled. Prior two deliveries were at term.   OB History     Gravida  7   Para  2   Term  2   Preterm      AB  4   Living  2      SAB  4   IAB      Ectopic      Multiple      Live Births  2           Past Medical History:  Diagnosis Date   Asthma    History of miscarriage 04/20/2021   Fourth miscarriage with 2 births out of 6   Palpitations    Precordial chest pain    a. 07/2021 Cor CTA: Ca2+ 0. Nl cors. No significant noncardiac findings.   S[place]ren's syndrome ([place]     Past Surgical History:  Procedure Laterality Date   [place]     CERVIX SURGERY     TONSILLECTOMY     UMBILICAL HERNIA REPAIR  2010    Family History  Problem Relation Age of Onset   Asthma Mother    Thyroid disease Sister    Sarcoidosis Brother    Crohn's disease Brother    Heart attack Maternal Grandmother     Social History   Tobacco Use   Smoking status: Never   Smokeless tobacco: Never  Vaping Use   Vaping Use: Never used  Substance Use Topics   Alcohol use: Not Currently    Comment: 1 glass of wine every 4 months   Drug use: Never    Allergies:  Allergies  Allergen Reactions   Metronidazole Anaphylaxis and Hives    Other reaction(s): hives    No  medications prior to admission.    Review of Systems Physical Exam   Blood pressure 119/67, pulse 83, temperature 98.4 F (36.9 C), temperature source Oral, resp. rate 20, height '5\' 4"'$  (1.626 m), weight 90.1 kg, last menstrual period 10/07/2021, SpO2 100 %.  Physical Exam Constitutional:      Appearance: Normal appearance.  Cardiovascular:     Rate and Rhythm: Normal rate and regular rhythm.     Pulses: Normal pulses.  Pulmonary:     Effort: Pulmonary effort is normal. No respiratory distress.  Abdominal:     Comments: Gravid, nontender.   Musculoskeletal:     Right lower leg: No edema.     Left lower leg: No edema.  Skin:    General: Skin is warm and dry.  Neurological:     General: No focal deficit present.     Mental Status: She is alert.  Psychiatric:        Mood and Affect: Mood normal.    Cervical exam per FNP- closed  and high. Labia was normal. Thick clumpy vaginal discharge noted.   NST-  Baseline: 145 Variability: good Accels/decels: 10x10 accels present, no decels Reactivity: reactive  MAU Course  Procedures  MDM Vitals and FHT reassuring. No signs/symptoms of preterm labor. Wet prep and gonorrhea/chlamydia obtained. UA with LE but no nitrites, rare bacteria. Culture obtained and pending.   Assessment and Plan  41 yo [id number] at 54w2dhere with vaginal pain and vaginal discharge. Not in preterm labor. NST reactive.   #Pelvic pain, vaginal discharge Wet prep with WBCs but no yeast, but exam suspicious for vaginal candidiasis.  - will treat empirically with topical terconazole daily for 7 days  Return precautions discussed including signs of preterm labor, vaginal bleeding, decreased FM, abdominal pain.   MCecilio Asper10/25/2023, 6:36 PM    Attestation of Supervision of Student:  I confirm that I have verified the information documented in the  resident's note and that I have also personally performed the history, physical exam and all medical  decision making activities.  I have verified that all services and findings are accurately documented in this student's note; and I agree with management and plan as outlined in the documentation. I have also made any necessary editorial changes.   EJorje Guild NP [facility] 04/09/2022 7:24 PM

## 2022-04-09 NOTE — Telephone Encounter (Signed)
Pt calling; 26wks preg; has questions about irritation and d/c.  [phone] concerned that she has passed some jelly like substance; doesn't know if okay or not.  Adv pt that is part of her mucus plug and is nothing to worry about; it does not mean she is going into labor; pt reassured and states she is in no pain, no bleeding.  As for the irritation to use monistat 7; use deoderant free feminine products and detergent.  If no better to have evaluated.

## 2022-04-09 NOTE — MAU Note (Signed)
.  Victoria Hobbs is a 41 y.o. at 24w2dhere in MAU reporting: constant pain in her vagina that began this morning and is worse when she is walking. She is unable to describe the pain, but says that "it feels like when I was in labor last time and they put that plastic thing in my vagina and I had a contraction". She also reports that she began to loose her mucous plug last weekend and she has lost more of it today ("quarter-sized amount of yellow discharge"), she reports her discharge has been more yellow since last weekend. She has been feeling normal movement at night and only "occasional little movements" during the day. She occasionally feels some pressure, but denies feeling any painful contractions. Denies VB.  LMP: N/A Onset of complaint: Today Pain score: 3/10 Vitals:   04/09/22 1740 04/09/22 1741  BP: 120/64 119/63  Pulse: 85 88  Resp: 20   Temp: 98.4 F (36.9 C)   SpO2: 100%      FHT:150 Lab orders placed from triage:  UA

## 2022-04-10 LAB — GC/CHLAMYDIA PROBE AMP (~~LOC~~) NOT AT ARMC
Chlamydia: NEGATIVE
Comment: NEGATIVE
Comment: NORMAL
Neisseria Gonorrhea: NEGATIVE

## 2022-04-12 LAB — CULTURE, OB URINE
Culture: 10000 — AB
Special Requests: NORMAL

## 2022-04-15 ENCOUNTER — Other Ambulatory Visit: Payer: Self-pay

## 2022-04-15 DIAGNOSIS — D259 Leiomyoma of uterus, unspecified: Secondary | ICD-10-CM

## 2022-04-15 DIAGNOSIS — O09522 Supervision of elderly multigravida, second trimester: Secondary | ICD-10-CM

## 2022-04-15 DIAGNOSIS — M3509 Sicca syndrome with other organ involvement: Secondary | ICD-10-CM

## 2022-04-17 ENCOUNTER — Other Ambulatory Visit: Payer: Self-pay

## 2022-04-17 ENCOUNTER — Ambulatory Visit: Payer: Medicaid Other | Attending: Maternal & Fetal Medicine

## 2022-04-17 VITALS — BP 118/71 | HR 87 | Temp 98.0°F | Ht 64.0 in | Wt 201.0 lb

## 2022-04-17 DIAGNOSIS — M3500 Sicca syndrome, unspecified: Secondary | ICD-10-CM | POA: Insufficient documentation

## 2022-04-17 DIAGNOSIS — O99891 Other specified diseases and conditions complicating pregnancy: Secondary | ICD-10-CM

## 2022-04-17 DIAGNOSIS — M3509 Sicca syndrome with other organ involvement: Secondary | ICD-10-CM | POA: Diagnosis not present

## 2022-04-17 DIAGNOSIS — D259 Leiomyoma of uterus, unspecified: Secondary | ICD-10-CM | POA: Insufficient documentation

## 2022-04-17 DIAGNOSIS — O26892 Other specified pregnancy related conditions, second trimester: Secondary | ICD-10-CM | POA: Diagnosis not present

## 2022-04-17 DIAGNOSIS — O99212 Obesity complicating pregnancy, second trimester: Secondary | ICD-10-CM

## 2022-04-17 DIAGNOSIS — O3412 Maternal care for benign tumor of corpus uteri, second trimester: Secondary | ICD-10-CM | POA: Insufficient documentation

## 2022-04-17 DIAGNOSIS — Z3A27 27 weeks gestation of pregnancy: Secondary | ICD-10-CM | POA: Insufficient documentation

## 2022-04-17 DIAGNOSIS — O09522 Supervision of elderly multigravida, second trimester: Secondary | ICD-10-CM | POA: Insufficient documentation

## 2022-04-17 DIAGNOSIS — O0990 Supervision of high risk pregnancy, unspecified, unspecified trimester: Secondary | ICD-10-CM

## 2022-04-17 DIAGNOSIS — E669 Obesity, unspecified: Secondary | ICD-10-CM

## 2022-04-22 ENCOUNTER — Other Ambulatory Visit: Payer: Medicaid Other

## 2022-04-22 ENCOUNTER — Encounter: Payer: Medicaid Other | Admitting: Licensed Practical Nurse

## 2022-05-02 ENCOUNTER — Other Ambulatory Visit (INDEPENDENT_AMBULATORY_CARE_PROVIDER_SITE_OTHER): Payer: Medicaid Other

## 2022-05-02 ENCOUNTER — Ambulatory Visit (INDEPENDENT_AMBULATORY_CARE_PROVIDER_SITE_OTHER): Payer: Medicaid Other | Admitting: Family Medicine

## 2022-05-02 VITALS — BP 117/75 | HR 99 | Wt 200.0 lb

## 2022-05-02 DIAGNOSIS — M3509 Sicca syndrome with other organ involvement: Secondary | ICD-10-CM

## 2022-05-02 DIAGNOSIS — Z3A29 29 weeks gestation of pregnancy: Secondary | ICD-10-CM | POA: Diagnosis not present

## 2022-05-02 DIAGNOSIS — O0990 Supervision of high risk pregnancy, unspecified, unspecified trimester: Secondary | ICD-10-CM

## 2022-05-02 DIAGNOSIS — Z8632 Personal history of gestational diabetes: Secondary | ICD-10-CM

## 2022-05-02 DIAGNOSIS — O09293 Supervision of pregnancy with other poor reproductive or obstetric history, third trimester: Secondary | ICD-10-CM | POA: Diagnosis not present

## 2022-05-02 DIAGNOSIS — O09893 Supervision of other high risk pregnancies, third trimester: Secondary | ICD-10-CM

## 2022-05-02 DIAGNOSIS — O09892 Supervision of other high risk pregnancies, second trimester: Secondary | ICD-10-CM

## 2022-05-02 DIAGNOSIS — O24913 Unspecified diabetes mellitus in pregnancy, third trimester: Secondary | ICD-10-CM

## 2022-05-02 DIAGNOSIS — D259 Leiomyoma of uterus, unspecified: Secondary | ICD-10-CM

## 2022-05-02 DIAGNOSIS — N96 Recurrent pregnancy loss: Secondary | ICD-10-CM | POA: Diagnosis not present

## 2022-05-02 DIAGNOSIS — O0993 Supervision of high risk pregnancy, unspecified, third trimester: Secondary | ICD-10-CM

## 2022-05-02 DIAGNOSIS — O09299 Supervision of pregnancy with other poor reproductive or obstetric history, unspecified trimester: Secondary | ICD-10-CM

## 2022-05-02 NOTE — Progress Notes (Signed)
   Transfer of Care PRENATAL VISIT NOTE  Subjective:  Victoria Hobbs is a 41 y.o. [id number] at 57w4dbeing seen today for ongoing prenatal care.  She is currently monitored for the following issues for this high-risk pregnancy and has DOE (dyspnea on exertion); Sjogren syndrome with other organ involvement ([place]; Seborrhea capitis; Eczema; Allergic rhinitis; History of recurrent miscarriages; Bilateral dry eyes; Supervision of high risk pregnancy, antepartum; Uterine leiomyoma; History of gestational diabetes in prior pregnancy, currently pregnant; Asthma, well controlled; and Nasal deviation on their problem list.  Patient reports no complaints.  Contractions: Irritability. Vag. Bleeding: None.  Movement: Present. Denies leaking of fluid.   The following portions of the patient's history were reviewed and updated as appropriate: allergies, current medications, past family history, past medical history, past social history, past surgical history and problem list.   Objective:   Vitals:   05/02/22 0843  BP: 117/75  Pulse: 99  Weight: 200 lb (90.7 kg)    Fetal Status: Fetal Heart Rate (bpm): 144 Fundal Height: 29 cm Movement: Present     General:  Alert, oriented and cooperative. Patient is in no acute distress.  Skin: Skin is warm and dry. No rash noted.   Cardiovascular: Normal heart rate noted  Respiratory: Normal respiratory effort, no problems with respiration noted  Abdomen: Soft, gravid, appropriate for gestational age.  Pain/Pressure: Absent     Pelvic: Cervical exam deferred        Extremities: Normal range of motion.  Edema: None  Mental Status: Normal mood and affect. Normal behavior. Normal judgment and thought content.   Assessment and Plan:  Pregnancy: [id number] 220w4d1. Supervision of high risk pregnancy, antepartum Transfer of care from [facility] and labs Declined flu shot Reviewed plan of care and practice [facility] and collaborative team  2. Sjogren syndrome with other organ involvement (HCC) Fetal Echo WNL Growth wk 4 BPPs weekly at 36 weeks, [facility]. History of gestational diabetes in prior pregnancy, currently pregnant 2ht GTT  No early screening performed at [facility]. History of recurrent miscarriages  Preterm labor symptoms and general obstetric precautions including but not limited to vaginal bleeding, contractions, leaking of fluid and fetal movement were reviewed in detail with the patient. Please refer to After Visit Summary for other counseling recommendations.   Return in about 2 weeks (around 05/16/2022) for Routine prenatal care.  Future Appointments  Date Time Provider DeSan [place]11/17/2023  9:35 AM NeCaren MacadamMD [facility]  05/16/2022  8:35 AM CoElly ModenaPeVickii ChafeMD [facility]  05/22/2022 10:00 AM [facility] US1 [facility] MFN[place]12/15/2023  9:35 AM NeCaren MacadamMD [facility]  06/13/2022 10:35 AM Anyanwu, UgSallyanne HaversMD [facility]    KiCaren MacadamMD

## 2022-05-02 NOTE — Progress Notes (Signed)
ROB [redacted]w[redacted]d  2hr GTT today  T-Dap:03/25/22 Flu Vaccine : Declined   CC: None

## 2022-05-03 DIAGNOSIS — R7303 Prediabetes: Secondary | ICD-10-CM | POA: Insufficient documentation

## 2022-05-03 DIAGNOSIS — O24419 Gestational diabetes mellitus in pregnancy, unspecified control: Secondary | ICD-10-CM | POA: Insufficient documentation

## 2022-05-03 DIAGNOSIS — O24913 Unspecified diabetes mellitus in pregnancy, third trimester: Secondary | ICD-10-CM | POA: Insufficient documentation

## 2022-05-03 LAB — CBC
Hematocrit: 31.9 % — ABNORMAL LOW (ref 34.0–46.6)
Hemoglobin: 10.8 g/dL — ABNORMAL LOW (ref 11.1–15.9)
MCH: 29.1 pg (ref 26.6–33.0)
MCHC: 33.9 g/dL (ref 31.5–35.7)
MCV: 86 fL (ref 79–97)
Platelets: 187 10*3/uL (ref 150–450)
RBC: 3.71 x10E6/uL — ABNORMAL LOW (ref 3.77–5.28)
RDW: 13.1 % (ref 11.7–15.4)
WBC: 7.4 10*3/uL (ref 3.4–10.8)

## 2022-05-03 LAB — RPR: RPR Ser Ql: NONREACTIVE

## 2022-05-03 LAB — GLUCOSE TOLERANCE, 2 HOURS W/ 1HR
Glucose, 1 hour: 187 mg/dL — ABNORMAL HIGH (ref 70–179)
Glucose, 2 hour: 172 mg/dL — ABNORMAL HIGH (ref 70–152)
Glucose, Fasting: 133 mg/dL — ABNORMAL HIGH (ref 70–91)

## 2022-05-03 LAB — HIV ANTIBODY (ROUTINE TESTING W REFLEX): HIV Screen 4th Generation wRfx: NONREACTIVE

## 2022-05-03 MED ORDER — BLOOD GLUCOSE MONITOR KIT: PACK | 0 refills | Status: DC

## 2022-05-06 ENCOUNTER — Telehealth: Payer: Self-pay | Admitting: *Deleted

## 2022-05-06 MED ORDER — FERROUS SULFATE 325 (65 FE) MG PO TABS: 325.0000 mg | ORAL_TABLET | ORAL | 3 refills | Status: DC

## 2022-05-06 NOTE — Telephone Encounter (Signed)
-----   Message from Caren Macadam, MD sent at 05/03/2022 10:04 AM EST ----- Patient had GDM. I have placed orders to meter, strips, lancets and DM education

## 2022-05-06 NOTE — Telephone Encounter (Signed)
Pt informed of results and supplies and also iron tablets sent in.

## 2022-05-07 ENCOUNTER — Other Ambulatory Visit: Payer: Self-pay | Admitting: *Deleted

## 2022-05-07 MED ORDER — ACCU-CHEK SOFTCLIX LANCETS MISC: 1.0000 | Freq: Four times a day (QID) | 12 refills | Status: DC

## 2022-05-07 MED ORDER — ACCU-CHEK GUIDE VI STRP: ORAL_STRIP | 12 refills | Status: DC

## 2022-05-13 ENCOUNTER — Other Ambulatory Visit: Payer: Self-pay | Admitting: *Deleted

## 2022-05-13 DIAGNOSIS — O24913 Unspecified diabetes mellitus in pregnancy, third trimester: Secondary | ICD-10-CM

## 2022-05-16 ENCOUNTER — Encounter: Payer: Self-pay | Admitting: Obstetrics and Gynecology

## 2022-05-16 ENCOUNTER — Ambulatory Visit (INDEPENDENT_AMBULATORY_CARE_PROVIDER_SITE_OTHER): Payer: Medicaid Other | Admitting: Obstetrics and Gynecology

## 2022-05-16 VITALS — BP 114/73 | HR 101 | Wt 198.0 lb

## 2022-05-16 DIAGNOSIS — O0990 Supervision of high risk pregnancy, unspecified, unspecified trimester: Secondary | ICD-10-CM

## 2022-05-16 DIAGNOSIS — M3509 Sicca syndrome with other organ involvement: Secondary | ICD-10-CM

## 2022-05-16 DIAGNOSIS — O24913 Unspecified diabetes mellitus in pregnancy, third trimester: Secondary | ICD-10-CM

## 2022-05-16 DIAGNOSIS — Z3A31 31 weeks gestation of pregnancy: Secondary | ICD-10-CM

## 2022-05-16 DIAGNOSIS — O0993 Supervision of high risk pregnancy, unspecified, third trimester: Secondary | ICD-10-CM

## 2022-05-16 NOTE — Progress Notes (Signed)
Hard time keeping fastings down

## 2022-05-16 NOTE — Progress Notes (Signed)
   PRENATAL VISIT NOTE  Subjective:  Victoria Hobbs is a Victoria Hobbs y.o. [id number] at 49w4dbeing seen today for ongoing prenatal care.  She is currently monitored for the following issues for this high-risk pregnancy and has DOE (dyspnea on exertion); Sjogren syndrome with other organ involvement (Victoria Hobbs; Victoria Hobbs; Victoria Hobbs; Victoria Hobbs; Victoria Hobbs; Victoria Hobbs; Supervision of high risk pregnancy, antepartum; Uterine leiomyoma; Victoria of gestational diabetes in prior pregnancy, currently pregnant; Asthma, well controlled; Nasal deviation; and Diabetes mellitus affecting pregnancy in third trimester on their problem list.  Patient reports no complaints.  Contractions: Not present. Vag. Bleeding: None.  Movement: Present. Denies leaking of fluid.   The following portions of the patient's Victoria were reviewed and updated as appropriate: allergies, current medications, past family Victoria, past medical Victoria, past social Victoria, past surgical Victoria and problem list.   Objective:   Vitals:   05/16/22 0822  BP: 114/73  Pulse: (!) 101  Weight: 198 lb (89.8 kg)    Fetal Status: Fetal Heart Rate (bpm): 141 Fundal Height: 32 cm Movement: Present     General:  Alert, oriented and cooperative. Patient is in no acute distress.  Skin: Skin is warm and dry. No rash noted.   Cardiovascular: Normal heart rate noted  Respiratory: Normal respiratory effort, no problems with respiration noted  Abdomen: Soft, gravid, appropriate for gestational age.  Pain/Pressure: Absent     Pelvic: Cervical exam deferred        Extremities: Normal range of motion.  Edema: None  Mental Status: Normal mood and affect. Normal behavior. Normal judgment and thought content.   Assessment and Plan:  Pregnancy: [id number] 399w4d. Supervision of high risk pregnancy, antepartum Patient is doing well She reports an episode of anxiety last week where she cried for 2 hours after her child  scared her. She denies a Victoria of anxiety or depression Patient plans postplacental IUD insertion  2. Diabetes mellitus affecting pregnancy in third trimester Patient scheduled for DM education next week. She started checking CBGs yesterday.  Fasting 121 yesterday and 99 this morning. Postprandial range from 117-121 Discussed benefits of protein rich snack at bedtime   3. Sjogren syndrome with other organ involvement ([place]unction[place]tenatal testing and ultrasounds per MFM schedule  Preterm labor symptoms and general obstetric precautions including but not limited to vaginal bleeding, contractions, leaking of fluid and fetal movement were reviewed in detail with the patient. Please refer to After Visit Summary for other counseling recommendations.   Return in about 2 weeks (around 05/30/2022) for in person, ROB, High risk.  Future Appointments  Date Time Provider De[place]12/10/2021 10:00 AM [place], ShGuadalupe MapleRN [facility] None  05/22/2022 10:00 AM [facility] US1 [facility] MFAuxier12/15/2023  9:35 AM NeCaren MacadamMD [facility]  06/13/2022 10:35 AM AnHarolyn RutherfordUgSallyanne HaversMD [facility]  06/27/2022  9:35 AM NeCaren MacadamMD [facility]  07/04/2022  9:35 AM NeCaren MacadamMD [facility]  07/15/2022  9:35 AM PiAletha HalimMD [facility]    PeMora BellmanMD

## 2022-05-19 ENCOUNTER — Telehealth: Payer: Self-pay

## 2022-05-19 ENCOUNTER — Other Ambulatory Visit: Payer: Self-pay

## 2022-05-19 ENCOUNTER — Inpatient Hospital Stay (HOSPITAL_COMMUNITY)
Admission: AD | Admit: 2022-05-19 | Discharge: 2022-05-19 | Disposition: A | Discharge status: Home / Self Care | Payer: Medicaid Other | Attending: Obstetrics & Gynecology | Admitting: Obstetrics & Gynecology

## 2022-05-19 ENCOUNTER — Encounter (HOSPITAL_COMMUNITY): Payer: Self-pay | Admitting: Obstetrics & Gynecology

## 2022-05-19 DIAGNOSIS — O479 False labor, unspecified: Secondary | ICD-10-CM | POA: Diagnosis not present

## 2022-05-19 DIAGNOSIS — O0993 Supervision of high risk pregnancy, unspecified, third trimester: Secondary | ICD-10-CM | POA: Insufficient documentation

## 2022-05-19 DIAGNOSIS — O0990 Supervision of high risk pregnancy, unspecified, unspecified trimester: Secondary | ICD-10-CM

## 2022-05-19 DIAGNOSIS — R102 Pelvic and perineal pain: Secondary | ICD-10-CM | POA: Insufficient documentation

## 2022-05-19 DIAGNOSIS — O24419 Gestational diabetes mellitus in pregnancy, unspecified control: Secondary | ICD-10-CM | POA: Insufficient documentation

## 2022-05-19 DIAGNOSIS — Z3A32 32 weeks gestation of pregnancy: Secondary | ICD-10-CM

## 2022-05-19 DIAGNOSIS — O26893 Other specified pregnancy related conditions, third trimester: Secondary | ICD-10-CM | POA: Diagnosis not present

## 2022-05-19 DIAGNOSIS — O4703 False labor before 37 completed weeks of gestation, third trimester: Secondary | ICD-10-CM | POA: Insufficient documentation

## 2022-05-19 DIAGNOSIS — N949 Unspecified condition associated with female genital organs and menstrual cycle: Secondary | ICD-10-CM

## 2022-05-19 LAB — URINALYSIS, ROUTINE W REFLEX MICROSCOPIC
Bilirubin Urine: NEGATIVE
Glucose, UA: NEGATIVE mg/dL
Hgb urine dipstick: NEGATIVE
Ketones, ur: 80 mg/dL — AB
Leukocytes,Ua: NEGATIVE
Nitrite: NEGATIVE
Protein, ur: 30 mg/dL — AB
Specific Gravity, Urine: 1.015 (ref 1.005–1.030)
pH: 6 (ref 5.0–8.0)

## 2022-05-19 LAB — WET PREP, GENITAL
Clue Cells Wet Prep HPF POC: NONE SEEN
Sperm: NONE SEEN
Trich, Wet Prep: NONE SEEN
WBC, Wet Prep HPF POC: <10 (ref <10)
Yeast Wet Prep HPF POC: NONE SEEN

## 2022-05-19 NOTE — MAU Note (Signed)
Victoria Hobbs is a 41 y.o. at 69w0dhere in MAU reporting: contractions started on Saturday, since then they have improved. Contractions seem less painful and not as consistent but states her husband wanted her evaluated. Denies bleeding or LOF. +FM  Onset of complaint: ongoing  Pain score: 0/10  Vitals:   05/19/22 1229  BP: 126/73  Pulse: (!) 102  Resp: 16  Temp: 98 F (36.7 C)  SpO2: 98%     FHT:EFM applied in room  Lab orders placed from triage: UA

## 2022-05-19 NOTE — MAU Provider Note (Signed)
History     CSN: [id number]  Arrival date and time: 05/19/22 1142   Event Date/Time   First Provider Initiated Contact with Patient 05/19/22 1234      Chief Complaint  Patient presents with   Contractions   Victoria Hobbs is a 41 y.o. [id number] at 51w0dwho presents today with contractions. She states that on the evening of 12/2 she started having contractions and then she had them all day on 12/3. Now today it seems a little better but she is still having cramps. She has been drinking a lot of water and resting. She denies any VB or  LOF. She reports normal fetal movement. Patient has GDM with this pregnancy. Otherwise her other 2 pregnancies were full term NSVD.   Pelvic Pain The patient's primary symptoms include pelvic pain. This is a new problem. The current episode started in the past 7 days. The problem occurs intermittently. The problem has been gradually improving. The problem affects both sides. The vaginal discharge was normal. There has been no bleeding. Nothing aggravates the symptoms. She has tried acetaminophen (rest, hydration) for the symptoms. The treatment provided significant relief. Sexual activity: denies intercourse in the last 24 hours.    OB History     Gravida  7   Para  2   Term  2   Preterm      AB  4   Living  2      SAB  4   IAB      Ectopic      Multiple      Live Births  2           Past Medical History:  Diagnosis Date   Asthma    History of miscarriage 04/20/2021   Fourth miscarriage with 2 births out of 6   Palpitations    Precordial chest pain    a. 07/2021 Cor CTA: Ca2+ 0. Nl cors. No significant noncardiac findings.   Sjogren's syndrome ([place] [employer]     Past Surgical History:  Procedure Laterality Date   BRAVO P[place]STUDY     CERVIX SURGERY     TONSILLECTOMY     UMBILICAL HERNIA REPAIR  2010    Family History  Problem Relation Age of Onset   Asthma Mother    Thyroid disease Sister    Sarcoidosis Brother    Crohn's  disease Brother    Heart attack Maternal Grandmother     Social History   Tobacco Use   Smoking status: Never   Smokeless tobacco: Never  Vaping Use   Vaping Use: Never used  Substance Use Topics   Alcohol use: Not Currently    Comment: 1 glass of wine every 4 months   Drug use: Never    Allergies:  Allergies  Allergen Reactions   Metronidazole Anaphylaxis and Hives    Other reaction(s): hives    Medications Prior to Admission  Medication Sig Dispense Refill Last Dose   Accu-Chek Softclix Lancets lancets 1 each by Other route 4 (four) times daily. 100 each 12 05/19/2022   albuterol (VENTOLIN HFA) 108 (90 Base) MCG/ACT inhaler daily.   Past Month   aspirin EC 81 MG tablet Take 162 mg by mouth once. Swallow whole.   05/18/2022   blood glucose meter kit and supplies KIT Dispense based on patient and insurance preference. Use up to four times daily as directed. 1 each 0 05/19/2022   ferrous sulfate (FERROUSUL) 325 (65 FE) MG tablet Take 1 tablet (  325 mg total) by mouth every other day. 30 tablet 3 05/18/2022   glucose blood (ACCU-CHEK GUIDE) test strip Use to check blood sugars four times a day was instructed 50 each 12 05/19/2022   hydroxychloroquine (PLAQUENIL) 200 MG tablet Take 200 mg by mouth 2 (two) times daily.   05/18/2022   Prenatal Vit-Fe Fumarate-FA (MULTIVITAMIN-PRENATAL) 27-0.8 MG TABS tablet Take 1 tablet by mouth daily at 12 noon.   05/18/2022   cholecalciferol (VITAMIN D3) 25 MCG (1000 UNIT) tablet Take 1,000 Units by mouth daily.      SYMBICORT 80-4.5 MCG/ACT inhaler Inhale 2 puffs into the lungs 2 (two) times daily.   More than a month    Review of Systems  Genitourinary:  Positive for pelvic pain.  All other systems reviewed and are negative.  Physical Exam   Blood pressure 126/73, pulse (!) 102, temperature 98 F (36.7 C), temperature source Oral, resp. rate 16, height _0  (1.626 m), weight 89 kg, last menstrual period 10/07/2021, SpO2 98 %.  Physical  Exam Constitutional:      Appearance: She is well-developed.  HENT:     Head: Normocephalic.  Eyes:     Pupils: Pupils are equal, round, and reactive to light.  Cardiovascular:     Rate and Rhythm: Normal rate and regular rhythm.     Heart sounds: Normal heart sounds.  Pulmonary:     Effort: Pulmonary effort is normal. No respiratory distress.     Breath sounds: Normal breath sounds.  Abdominal:     Palpations: Abdomen is soft.     Tenderness: There is no abdominal tenderness.  Genitourinary:    Vagina: No bleeding. Vaginal discharge: mucusy.    Comments: External: no lesion Vagina: small amount of white discharge Dilation: Closed Effacement (%): Thick Exam by:: Lesslie Mossa cnm   Musculoskeletal:        General: Normal range of motion.     Cervical back: Normal range of motion and neck supple.  Skin:    General: Skin is warm and dry.  Neurological:     Mental Status: She is alert and oriented to person, place, and time.  Psychiatric:        Mood and Affect: Mood normal.        Behavior: Behavior normal.    NST:  Baseline: 135 Variability: moderate Accels: 15x15 Decels: none Toco: none Reactive/Appropriate for GA   Results for orders placed or performed during the hospital encounter of 05/19/22 (from the past 24 hour(s))  Urinalysis, Routine w reflex microscopic Urine, Clean Catch     Status: Abnormal   Collection Time: 05/19/22 12:19 PM  Result Value Ref Range   Color, Urine YELLOW YELLOW   APPearance HAZY (A) CLEAR   Specific Gravity, Urine 1.015 1.005 - 1.030   pH 6.0 5.0 - 8.0   Glucose, UA NEGATIVE NEGATIVE mg/dL   Hgb urine dipstick NEGATIVE NEGATIVE   Bilirubin Urine NEGATIVE NEGATIVE   Ketones, ur 80 (A) NEGATIVE mg/dL   Protein, ur 30 (A) NEGATIVE mg/dL   Nitrite NEGATIVE NEGATIVE   Leukocytes,Ua NEGATIVE NEGATIVE   RBC / HPF 0-5 0 - 5 RBC/hpf   WBC, UA 6-10 0 - 5 WBC/hpf   Bacteria, UA FEW (A) NONE SEEN   Squamous Epithelial / LPF 6-10 0 - 5   Mucus  PRESENT    Hyaline Casts, UA PRESENT   Wet prep, genital     Status: None   Collection Time: 05/19/22 12:47 PM  Result Value  Ref Range   Yeast Wet Prep HPF POC NONE SEEN NONE SEEN   Trich, Wet Prep NONE SEEN NONE SEEN   Clue Cells Wet Prep HPF POC NONE SEEN NONE SEEN   WBC, Wet Prep HPF POC <10 <10   Sperm NONE SEEN     MAU Course  Procedures  MDM   Assessment and Plan   1. Supervision of high risk pregnancy, antepartum   2. Braxton Hicks contractions   3. [redacted] weeks gestation of pregnancy   4. Round ligament pain    DC home in stable condition 3rd Trimester precautions  PTL precautions  Fetal kick counts RX: none  Return to MAU as needed FU with OB as planned   [place] for [place] at [facility] Follow up.   Specialty: Obstetrics and Gynecology Why: As scheduled Contact information: [place] DNP, CNM  05/19/22  1:31 PM

## 2022-05-19 NOTE — Telephone Encounter (Signed)
Return call to pt regarding ongoing contractions and consistent cramping pain since the weekend. Pt has increased water intake, Gatorade, walking, took tylenol, tried laying down and switching sides. Still no relief. Contractions were 10 mins apart on Saturday. Sunday contractions were more irregular. Notes nausea and back pain as well +FM, No leaking of fluid, no vaginal bleeding. Pt advised since no provider here at the office at this time may be best to go to MAU for an evaluation.  Pt agreeable and voiced understanding.

## 2022-05-20 ENCOUNTER — Encounter: Payer: Medicaid Other | Attending: Family Medicine | Admitting: *Deleted

## 2022-05-20 ENCOUNTER — Encounter: Payer: Self-pay | Admitting: *Deleted

## 2022-05-20 ENCOUNTER — Other Ambulatory Visit: Payer: Self-pay

## 2022-05-20 VITALS — BP 110/72 | Ht 64.0 in | Wt 198.6 lb

## 2022-05-20 DIAGNOSIS — Z3A33 33 weeks gestation of pregnancy: Secondary | ICD-10-CM | POA: Diagnosis not present

## 2022-05-20 DIAGNOSIS — O24913 Unspecified diabetes mellitus in pregnancy, third trimester: Secondary | ICD-10-CM | POA: Insufficient documentation

## 2022-05-20 DIAGNOSIS — O24410 Gestational diabetes mellitus in pregnancy, diet controlled: Secondary | ICD-10-CM

## 2022-05-20 DIAGNOSIS — O99213 Obesity complicating pregnancy, third trimester: Secondary | ICD-10-CM

## 2022-05-20 DIAGNOSIS — O09523 Supervision of elderly multigravida, third trimester: Secondary | ICD-10-CM

## 2022-05-20 DIAGNOSIS — M3500 Sicca syndrome, unspecified: Secondary | ICD-10-CM

## 2022-05-20 LAB — CULTURE, OB URINE: Culture: NO GROWTH

## 2022-05-20 LAB — GC/CHLAMYDIA PROBE AMP (~~LOC~~) NOT AT ARMC
Chlamydia: NEGATIVE
Comment: NEGATIVE
Comment: NORMAL
Neisseria Gonorrhea: NEGATIVE

## 2022-05-20 NOTE — Patient Instructions (Signed)
Read booklet on Gestational Diabetes Follow Gestational Meal Planning Guidelines Don't skip meals - eat 1 protein and 1 carbohydrate serving Include 1 serving of protein and 1 serving of carbohydrate for snacks Complete a 3 Day Food Record and bring to next appointment Check blood sugars 4 x day - before breakfast and 2 hrs after every meal and record  Bring blood sugar log to all appointments Purchase urine ketone strips if instructed by MD and check urine ketones every am:  If + increase bedtime snack to 1 protein and 2 carbohydrate servings Walk 20-30 minutes at least 5 x week if permitted by MD

## 2022-05-20 NOTE — Progress Notes (Signed)
Diabetes Self-Management Education  Visit Type: First/Initial  Appt. Start Time: 1020 Appt. End Time: 9323  05/20/2022  [name], identified by name and date of birth, is a 41 y.o. female with a diagnosis of Diabetes: Gestational Diabetes.   ASSESSMENT  Blood pressure 110/72, height '5\' 4"'$  (1.626 m), weight 198 lb 9.6 oz (90.1 kg), last menstrual period 10/07/2021, estimated date of delivery 07/14/2022 Body mass index is 34.09 kg/m.   Diabetes Self-Management Education - 05/20/22 1214       Visit Information   Visit Type First/Initial      Initial Visit   Diabetes Type Gestational Diabetes    Date Diagnosed 2 weeks    Are you currently following a meal plan? Yes    What type of meal plan do you follow? "less carbs"    Are you taking your medications as prescribed? Yes      Health Coping   How would you rate your overall health? Good      Psychosocial Assessment   Patient Belief/Attitude about Diabetes Motivated to manage diabetes   "nervous"   What is the hardest part about your diabetes right now, causing you the most concern, or is the most worrisome to you about your diabetes?   Making healty food and beverage choices    Self-care barriers None    Self-management support Doctor's office;Family    Other persons present Other (comment)   75 year old daughter   Patient Concerns Nutrition/Meal planning;Glycemic Control;Monitoring    Special Needs None    Preferred Learning Style Visual;Other (comment)   talking/discussion   Learning Readiness Change in progress    How often do you need to have someone help you when you read instructions, pamphlets, or other written materials from your doctor or pharmacy? 1 - Never    What is the last grade level you completed in school? college      Pre-Education Assessment   Patient understands the diabetes disease and treatment process. Needs Instruction    Patient understands incorporating nutritional management into lifestyle.  Needs Instruction    Patient undertands incorporating physical activity into lifestyle. Needs Review    Patient understands using medications safely. Needs Instruction    Patient understands monitoring blood glucose, interpreting and using results Needs Review    Patient understands prevention, detection, and treatment of acute complications. Needs Instruction    Patient understands prevention, detection, and treatment of chronic complications. Needs Instruction    Patient understands how to develop strategies to address psychosocial issues. Needs Instruction    Patient understands how to develop strategies to promote health/change behavior. Needs Instruction      Complications   How often do you check your blood sugar? 3-4 times/day    Fasting Blood glucose range (mg/dL) 70-129   FBG's 88-121 mg/dL - 5/6 elevated   Postprandial Blood glucose range (mg/dL) 70-129;130-179   She has been checking 1 hr pp - breakfast 88-138 mg/dL - 3 readings; lunch 101-112 mg/dL; supper 88-107 mg/dL   Have you had a dilated eye exam in the past 12 months? No    Have you had a dental exam in the past 12 months? Yes    Are you checking your feet? Yes    How many days per week are you checking your feet? 7      Dietary Intake   Breakfast skips some meals or eats mostly protein - suasage, egg and cheese sandwich; jerky; sausage and cheese    Snack (  morning) 2-3 snacks/day - grapes, strawberries, pickles and cheese, celery and peanut butter, popcorn, Greek yogurt    Lunch steak salad with Caesar dressing; toast with butter, chickenm, cheese and pickles; nuggets; boiled fish and veggies (non-starchy)    Dinner meat loaf and salad, chicken wings; peppers, onions, broccoli, green beans, peas, corn, beans    Beverage(s) water, Gatorade zero      Activity / Exercise   Activity / Exercise Type ADL's      Patient Education   Previous Diabetes Education Yes (please comment)   Pt had GDM with last pregnancy 2 years ago    Disease Pathophysiology Definition of diabetes, type 1 and 2, and the diagnosis of diabetes;Factors that contribute to the development of diabetes    Healthy Eating Role of diet in the treatment of diabetes and the relationship between the three main macronutrients and blood glucose level;Food label reading, portion sizes and measuring food.;Reviewed blood glucose goals for pre and post meals and how to evaluate the patients' food intake on their blood glucose level.    Being Active Role of exercise on diabetes management, blood pressure control and cardiac health.    Medications Other (comment)   Limited use of oral medications during pregnancy and potential for insulin   Monitoring Purpose and frequency of SMBG.;Taught/discussed recording of test results and interpretation of SMBG.;Identified appropriate SMBG and/or A1C goals.;Ketone testing, when, how.    Chronic complications Relationship between chronic complications and blood glucose control    Diabetes Stress and Support Identified and addressed patients feelings and concerns about diabetes    Preconception care Pregnancy and GDM  Role of pre-pregnancy blood glucose control on the development of the fetus;Reviewed with patient blood glucose goals with pregnancy;Role of family planning for patients with diabetes      Individualized Goals (developed by patient)   Reducing Risk Other (comment)   improve blood sugars, prevent diabetes complications     Outcomes   Expected Outcomes Demonstrated interest in learning. Expect positive outcomes         Individualized Plan for Diabetes Self-Management Training:   Learning Objective:  Patient will have a greater understanding of diabetes self-management. Patient education plan is to attend individual and/or group sessions per assessed needs and concerns.   Plan:   Patient Instructions  Read booklet on Gestational Diabetes Follow Gestational Meal Planning Guidelines Don't skip meals - eat 1  protein and 1 carbohydrate serving Include 1 serving of protein and 1 serving of carbohydrate for snacks Complete a 3 Day Food Record and bring to next appointment Check blood sugars 4 x day - before breakfast and 2 hrs after every meal and record  Bring blood sugar log to all appointments Purchase urine ketone strips if instructed by MD and check urine ketones every am:  If + increase bedtime snack to 1 protein and 2 carbohydrate servings Walk 20-30 minutes at least 5 x week if permitted by MD   Expected Outcomes:  Demonstrated interest in learning. Expect positive outcomes  Education material provided:  Gestational Booklet Gestational Meal Planning Guidelines Simple Meal Plan 3 Day Food Record Goals for a Healthy Pregnancy  If problems or questions, patient to contact team via:   Johny Drilling, RN, [place], [place] [phone]  Future DSME appointment:  May 28, 2022 with the dietitian

## 2022-05-22 ENCOUNTER — Ambulatory Visit: Payer: Medicaid Other | Attending: Obstetrics

## 2022-05-22 ENCOUNTER — Other Ambulatory Visit: Payer: Self-pay

## 2022-05-22 VITALS — BP 130/77 | HR 99 | Temp 98.1°F | Ht 64.0 in | Wt 198.5 lb

## 2022-05-22 DIAGNOSIS — Z3A32 32 weeks gestation of pregnancy: Secondary | ICD-10-CM | POA: Insufficient documentation

## 2022-05-22 DIAGNOSIS — O09523 Supervision of elderly multigravida, third trimester: Secondary | ICD-10-CM | POA: Insufficient documentation

## 2022-05-22 DIAGNOSIS — O99213 Obesity complicating pregnancy, third trimester: Secondary | ICD-10-CM | POA: Insufficient documentation

## 2022-05-22 DIAGNOSIS — O0990 Supervision of high risk pregnancy, unspecified, unspecified trimester: Secondary | ICD-10-CM

## 2022-05-22 DIAGNOSIS — O99891 Other specified diseases and conditions complicating pregnancy: Secondary | ICD-10-CM

## 2022-05-22 DIAGNOSIS — M3500 Sicca syndrome, unspecified: Secondary | ICD-10-CM | POA: Insufficient documentation

## 2022-05-22 DIAGNOSIS — E669 Obesity, unspecified: Secondary | ICD-10-CM

## 2022-05-28 ENCOUNTER — Encounter: Payer: Self-pay | Admitting: Dietician

## 2022-05-28 ENCOUNTER — Encounter: Payer: Medicaid Other | Admitting: Dietician

## 2022-05-28 VITALS — BP 100/62 | Ht 64.0 in | Wt 199.8 lb

## 2022-05-28 DIAGNOSIS — O24913 Unspecified diabetes mellitus in pregnancy, third trimester: Secondary | ICD-10-CM | POA: Diagnosis not present

## 2022-05-28 DIAGNOSIS — O24410 Gestational diabetes mellitus in pregnancy, diet controlled: Secondary | ICD-10-CM

## 2022-05-28 NOTE — Progress Notes (Signed)
Patient's BG record indicates fasting BGs ranging 88-117, and post-meal BGs ranging 80s - 120; one reading of 127 after eating cornbread and soup with rice. Patient reports not sleeping well most nights; discussed likely impact on fasting BGs. Patient's food diary indicates some missed meals due to timing of testing; lack of hunger especially on days she takes iron supplement. There is a protein source with all meals and controlled portions of carbs most of the time.  Provided basic balanced meal plan, and wrote individualized menus based on patient's food preferences. Discussed importance of eating at regular intervals and simple meal and snack options when not hungry/ not feeling well. Instructed patient on food safety, including avoidance of Listeriosis, and limiting mercury from fish. Discussed importance of maintaining healthy lifestyle habits to reduce risk of Type 2 DM as well as Gestational DM with any future pregnancies. Advised patient to use any remaining testing supplies to test some BGs after delivery, and to have BG tested ideally annually, as well as prior to attempting future pregnancies.

## 2022-05-28 NOTE — Patient Instructions (Signed)
Plan to eat a meal or snack about every 4 hours during the day. Avoid going longer than 5 hours without any food.  Include a protein food + starch or fruit +/or veg with each meal or snack

## 2022-05-30 ENCOUNTER — Encounter: Payer: Self-pay | Admitting: Family Medicine

## 2022-05-30 ENCOUNTER — Ambulatory Visit (INDEPENDENT_AMBULATORY_CARE_PROVIDER_SITE_OTHER): Payer: Medicaid Other | Admitting: Family Medicine

## 2022-05-30 VITALS — BP 116/71 | HR 93 | Wt 199.0 lb

## 2022-05-30 DIAGNOSIS — O0990 Supervision of high risk pregnancy, unspecified, unspecified trimester: Secondary | ICD-10-CM

## 2022-05-30 DIAGNOSIS — O0993 Supervision of high risk pregnancy, unspecified, third trimester: Secondary | ICD-10-CM

## 2022-05-30 DIAGNOSIS — O24913 Unspecified diabetes mellitus in pregnancy, third trimester: Secondary | ICD-10-CM

## 2022-05-30 DIAGNOSIS — Z3A33 33 weeks gestation of pregnancy: Secondary | ICD-10-CM

## 2022-05-30 DIAGNOSIS — M3509 Sicca syndrome with other organ involvement: Secondary | ICD-10-CM

## 2022-05-30 NOTE — Progress Notes (Signed)
ROB [redacted]w[redacted]d CC: None

## 2022-05-30 NOTE — Progress Notes (Signed)
   PRENATAL VISIT NOTE  Subjective:  Victoria Hobbs is a 42 y.o. [id number] at 56w4dbeing seen today for ongoing prenatal care.  She is currently monitored for the following issues for this high-risk pregnancy and has DOE (dyspnea on exertion); Sjogren syndrome with other organ involvement ([place]; Seborrhea capitis; Eczema; Allergic rhinitis; History of recurrent miscarriages; Bilateral dry eyes; Supervision of high risk pregnancy, antepartum; Uterine leiomyoma; History of gestational diabetes in prior pregnancy, currently pregnant; Asthma, well controlled; Nasal deviation; and Diabetes mellitus affecting pregnancy in third trimester on their problem list.  Patient reports no complaints.  Contractions: Irritability. Vag. Bleeding: None.  Movement: Present. Denies leaking of fluid.   The following portions of the patient's history were reviewed and updated as appropriate: allergies, current medications, past family history, past medical history, past social history, past surgical history and problem list.   Objective:   Vitals:   05/30/22 0923  BP: 116/71  Pulse: 93  Weight: 199 lb (90.3 kg)    Fetal Status: Fetal Heart Rate (bpm): 158   Movement: Present     General:  Alert, oriented and cooperative. Patient is in no acute distress.  Skin: Skin is warm and dry. No rash noted.   Cardiovascular: Normal heart rate noted  Respiratory: Normal respiratory effort, no problems with respiration noted  Abdomen: Soft, gravid, appropriate for gestational age.  Pain/Pressure: Present     Pelvic: Cervical exam deferred        Extremities: Normal range of motion.  Edema: None  Mental Status: Normal mood and affect. Normal behavior. Normal judgment and thought content.   Assessment and Plan:  Pregnancy: [id number] 381w4d. Diabetes mellitus affecting pregnancy in third trimester Fastings are not well controlled--typically 105-110s. She met with nutrition and had strategies to try to lower. PP are very  well controlled. Only 1 above range (127) Has growth scheduled Last growth was 12/7 and infant was 95th% with AC> 99th% which is consistent with a excelerated growth pattern related to GDM Discussed possible IOL 38 wks due to LGA  2. Sjogren syndrome with other organ involvement (HCMc[place]ickStable  3. Supervision of high risk pregnancy, antepartum Doing well Reviewed use of Doula Having some pelvic pressure   Preterm labor symptoms and general obstetric precautions including but not limited to vaginal bleeding, contractions, leaking of fluid and fetal movement were reviewed in detail with the patient. Please refer to After Visit Summary for other counseling recommendations.   Return in about 2 weeks (around 06/13/2022) for Routine prenatal care.  Future Appointments  Date Time Provider [place]12/29/2023 10:35 AM AnHarolyn RutherfordUgSallyanne HaversMD [facility]  06/19/2022 11:00 AM [facility] [place]1/04/2023 11:00 AM [facility] US1 [facility]1/05/2023  9:35 AM NeCaren MacadamMD [facility]  07/04/2022  9:35 AM NeCaren MacadamMD [facility]  07/10/2022 11:00 AM [facility] US1 [facility] MFH[place]1/30/2024  9:35 AM PiAletha HalimMD [facility]    KiCaren MacadamMD

## 2022-06-13 ENCOUNTER — Other Ambulatory Visit (HOSPITAL_COMMUNITY)
Admission: RE | Admit: 2022-06-13 | Discharge: 2022-06-13 | Disposition: A | Discharge status: Home / Self Care | Payer: Medicaid Other | Source: Ambulatory Visit | Attending: Obstetrics & Gynecology | Admitting: Obstetrics & Gynecology

## 2022-06-13 ENCOUNTER — Ambulatory Visit (INDEPENDENT_AMBULATORY_CARE_PROVIDER_SITE_OTHER): Payer: Medicaid Other | Admitting: Obstetrics & Gynecology

## 2022-06-13 ENCOUNTER — Encounter: Payer: Self-pay | Admitting: Obstetrics & Gynecology

## 2022-06-13 VITALS — BP 115/75 | HR 88 | Wt 198.0 lb

## 2022-06-13 DIAGNOSIS — Z3A35 35 weeks gestation of pregnancy: Secondary | ICD-10-CM | POA: Insufficient documentation

## 2022-06-13 DIAGNOSIS — O0993 Supervision of high risk pregnancy, unspecified, third trimester: Secondary | ICD-10-CM

## 2022-06-13 DIAGNOSIS — O0990 Supervision of high risk pregnancy, unspecified, unspecified trimester: Secondary | ICD-10-CM

## 2022-06-13 DIAGNOSIS — R7309 Other abnormal glucose: Secondary | ICD-10-CM

## 2022-06-13 DIAGNOSIS — M3509 Sicca syndrome with other organ involvement: Secondary | ICD-10-CM

## 2022-06-13 DIAGNOSIS — O24419 Gestational diabetes mellitus in pregnancy, unspecified control: Secondary | ICD-10-CM

## 2022-06-13 MED ORDER — PEN NEEDLES 33G X 4 MM MISC: 5 refills | Status: DC

## 2022-06-13 MED ORDER — INSULIN GLARGINE 100 UNIT/ML SOLOSTAR PEN: 10.0000 [IU] | PEN_INJECTOR | Freq: Every day | SUBCUTANEOUS | 11 refills | Status: DC

## 2022-06-13 NOTE — Patient Instructions (Signed)
IOL scheduled 06/26/22 at midnight, arrive 11:45 pm on 06/25/22

## 2022-06-13 NOTE — Progress Notes (Signed)
PRENATAL VISIT NOTE  Subjective:  Victoria Hobbs is a 41 y.o. [id number] at 61w4dbeing seen today for ongoing prenatal care.  She is currently monitored for the following issues for this high-risk pregnancy and has DOE (dyspnea on exertion); Sjogren syndrome with other organ involvement ([place]; Seborrhea capitis; Eczema; Allergic rhinitis; History of recurrent miscarriages; Bilateral dry eyes; Supervision of high risk pregnancy, antepartum; Uterine leiomyoma; History of gestational diabetes in prior pregnancy, currently pregnant; Asthma, well controlled; Nasal deviation; and Gestational diabetes mellitus in third trimester on their problem list.  Patient reports  brown discharge 4 days ago, none recently. Now has white discharge, not irritating .  Contractions: Irregular. Vag. Bleeding: None.  Movement: Present. Denies leaking of fluid.   The following portions of the patient's history were reviewed and updated as appropriate: allergies, current medications, past family history, past medical history, past social history, past surgical history and problem list.   Objective:   Vitals:   06/13/22 1109  BP: 115/75  Pulse: 88  Weight: 198 lb (89.8 kg)    Fetal Status: Fetal Heart Rate (bpm): 152   Movement: Present     General:  Alert, oriented and cooperative. Patient is in no acute distress.  Skin: Skin is warm and dry. No rash noted.   Cardiovascular: Normal heart rate noted  Respiratory: Normal respiratory effort, no problems with respiration noted  Abdomen: Soft, gravid, appropriate for gestational age.  Pain/Pressure: Present     Pelvic: Cultures collected in the presence of a chaperone        Extremities: Normal range of motion.  Edema: None  Mental Status: Normal mood and affect. Normal behavior. Normal judgment and thought content.   UKoreaMFM OB FOLLOW UP  Result Date: 05/22/2022 ----------------------------------------------------------------------  OBSTETRICS REPORT                        (Signed Final 05/22/2022 11:08 am) ---------------------------------------------------------------------- Patient Info  ID #:       [id number]                         D.O.B.:  [id number](41 yrs)  Name:       Victoria Hobbs                   Visit Date: 05/22/2022 10:20 am ---------------------------------------------------------------------- Performed By  Attending:        VJohnell ComingsMD         Ref. Address:     [facility]                                                             [facility]                                                             [address]  [place]                                                             [place]  Performed By:     Rolm Bookbinder RDMS     Location[facility]         [facility]                                                             [facility]  Referred By:      Rubie Maid                    MD ---------------------------------------------------------------------- Orders  #  Description                           Code        Ordered By  1  [place] MFM OB FOLLOW UP                   12458.09    Peterson Ao ----------------------------------------------------------------------  #  Order #                     Accession #                Episode #  1  [id number]                   [id number]                 [id number] ---------------------------------------------------------------------- Indications  Advanced maternal age multigravida 53+,        O61.523  third trimester  Obesity complicating pregnancy, third          O99.213  trimester  Medical complication of pregnancy (Sjogren     O26.90  syndrome)  [redacted] weeks gestation of pregnancy                Z3A.32  ---------------------------------------------------------------------- Vital  Signs                                                 Height:        5'4" ---------------------------------------------------------------------- Fetal Evaluation  Num Of Fetuses:         1  Fetal Heart Rate(bpm):  148  Cardiac Activity:       Observed  Presentation:           Cephalic  Placenta:               Anterior  P. Cord Insertion:      Visualized  Amniotic Fluid  AFI FV:      Subjectively upper-normal  AFI Sum(cm)     %Tile       Largest Pocket(cm)  22.52           87          10.14  RUQ(cm)       RLQ(cm)       LUQ(cm)        LLQ(cm)  10.14         2.99          4.48           4.91 ---------------------------------------------------------------------- Biometry  BPD:     88.14  mm     G. Age:  35w 4d         99  %    CI:        76.68   %    70 - 86                                                          FL/HC:      19.0   %    19.1 - 21.3  HC:    318.85   mm     G. Age:  35w 6d         93  %    HC/AC:      1.00        0.96 - 1.17  AC:    317.68   mm     G. Age:  35w 5d       > 99  %    FL/BPD:     68.7   %    71 - 87  FL:      60.55  mm     G. Age:  31w 3d         15  %    FL/AC:      19.1   %    20 - 24  Est. FW:    2469  gm      5 lb 7 oz     95  % ---------------------------------------------------------------------- OB History  Gravidity:    7         Term:   2         SAB:   4  Living:       2 ---------------------------------------------------------------------- Gestational Age  LMP:           32w 3d        Date:  10/07/21                  EDD:   07/14/22  U/S Today:     34w 5d                                        EDD:   06/28/22  Best:          32w 3d     Det. By:  LMP  (10/07/21)          EDD:   07/14/22 ---------------------------------------------------------------------- Anatomy  Cranium:               Appears normal         Kidneys:                Appear normal  Stomach:               Appears normal, left   Bladder:                 Appears normal                         sided  Other:  Sacral spine was visualized and appears wnl. All other anatomy has          been seen ---------------------------------------------------------------------- Comments  This patient was seen for a follow up growth scan due to  advanced maternal age and Sjogren's syndrome treated with  Plaquenil.  She was recently diagnosed with diet-controlled  gestational diabetes.  She had a normal fetal echocardiogram performed with [facility]  pediatric cardiology.  She was informed that the fetal growth and amniotic fluid  level appears appropriate for her gestational age.  Due to Sjogren syndrome, she will start weekly fetal testing at  36 weeks.  Weekly fetal testing should be started prior to 36 weeks  should she require insulin or metformin for treatment of  gestational diabetes.  A follow-up growth scan and BPP was scheduled in 4 weeks. ----------------------------------------------------------------------                   Johnell Comings, MD Electronically Signed Final Report   05/22/2022 11:08 am ----------------------------------------------------------------------   Assessment and Plan:  Pregnancy: [id number] at 29w4d1. Gestational diabetes mellitus (GDM) in third trimester 2. Poor glycemic control Elevated fastings to 120s, postprandial in 140-150s with one as hight as 179. Also has known LGA, last growth scan on12/7 and infant had EFW of 95th% with AC> 99th%. Scheduled for growth and BPP next week. .  Today, recommended initiation of insulin therapy, risks and benefits discussed. She agreed to this, will start Lantus 10 units qhs and titrate upwards accordingly. NST performed was also today was reviewed and was found to be reactive, she has BPP next week.  Fetal movement precautions advised.  Due to poor glycemic control and LGA, IOL scheduled at 37 weeks (first available slot on L&D was 06/26/22 at [facility] but patient will go in 06/25/22 late evening at [redacted]w[redacted]d  IOL  orders signed and held. - Fetal nonstress test - insulin glargine (LANTUS) 100 UNIT/ML Solostar Pen; Inject 10 Units into the skin at bedtime.  Dispense: 15 mL; Refill: 11 - Insulin Pen Needle (PEN NEEDLES) 33G X 4 MM MISC; Please use as directed  Dispense: 100 each; Refill: 5  3. Sjogren syndrome with other organ involvement ([place]  Stable, already scheduled for weekly antenatal testing starting at 67 weeks with MFM.  4. [redacted] weeks gestation of pregnancy 5. Supervision of high risk pregnancy, antepartum Pelvic cultures done today, will follow up results and manage accordingly. - Cervicovaginal ancillary only - Strep Gp B NAA Preterm labor symptoms and general obstetric precautions including but not limited to vaginal bleeding, contractions, leaking of fluid and fetal movement were reviewed in detail with the patient. Please refer to After Visit Summary for other counseling recommendations.   Return in about 1 week (around 06/20/2022) for OFFICE OB VISIT (MD only).  Future Appointments  Date Time Provider [place]  06/19/2022 11:00 AM [facility]  06/20/2022  8:35 AM Caren Macadam, MD [facility]  06/26/2022 12:00 AM [facility] [place] [facility] None  06/26/2022 11:00 AM [facility] US1 [facility]    Verita Schneiders, MD

## 2022-06-15 LAB — STREP GP B NAA: Strep Gp B NAA: NEGATIVE

## 2022-06-16 NOTE — L&D Delivery Note (Signed)
Delivery Note:   Victoria Hobbs [id number] at [redacted]w[redacted]d Admitting diagnosis: Poor glycemic control [R73.09] Normal labor [O80, Z37.9] Risks: polyhydramnios  First Stage:  Induction of labor: GDM and polyhydramnios Onset of labor: 1900 Augmentation: AROM and Pitocin ROM: 1930 Active labor onset: 1900 Analgesia /Anesthesia/Pain control intrapartum: Epidural .  Pt was 8.5 cms for several hours and elected for epidural.  Second Stage:  Complete dilation at 06/24/2022  0542 Onset of pushing at 0545 FHR second stage Cat 1/2 (variables)   Pushing in dorsal lithotomy position with CNM and L&D staff support, FOB and doula present for birth and supportive. Nuchal Cord: Yes  Delivery of a Live born female  Birth Weight:  pending APGAR: 8, 9  Newborn Delivery   Birth date/time: 06/24/2022 06:16:00 Delivery type: Vaginal, Spontaneous     APGAR: 10 min-    Infant delivered in cephalic presentation, in ROA position and restituted to LOA position. Nuchal cord, unable to reduce, so delivered through. Assisted by FOB after head delivered.  Cord double clamped after 1 minute, cut by FOB.  Collection of cord blood for typing completed. Cord blood donation-None  Arterial cord blood sample-No    Third Stage:  Placenta delivered-Spontaneous  with 3 vessels . Uterine tone firm bleeding minimal 20u Pitocin in 500cc LR given as a bolus prior delivery of placenta Uterotonics: none Placenta to l&D.  None  laceration identified.  Episiotomy:None  Local analgesia: none  Repair:n/a Est. Blood Loss (mRX):54.00  Complications: None   Mom to postpartum.  Baby girlto Couplet care / Skin to Skin.  Delivery Report:   Review the Delivery Report for details.     POST-PLACENTAL IUD INSERTION PROCEDURE NOTE Patient name: Victoria WehrliMRN [id number] Date of birth: 105-15-1982 The risks and benefits of the method and placement have been thouroughly reviewed with the patient and all questions were  answered.  Specifically the patient is aware of failure rate of 06/998, expulsion of the IUD and of possible perforation.  The patient is aware of irregular bleeding due to the method and understands the incidence of irregular bleeding diminishes with time.   Signed copy of informed consent in chart.   Vaginal, labial and perineal areas thoroughly inspected for lacerations. Lacerations: none laceration identified and if needed was repaired prior to IUD insertion  Pt's IUD choice: Mirena  Time out was performed.    IUD removed from insertion device and grasped between sterile gloved fingers. Fundus identified through abdominal wall using non-insertion hand. IUD inserted to fundus with bimanual technique. IUD carefully released at the fundus and insertion hand gently removed from vagina.    Strings trimmed to the level of the introitus. Patient tolerated procedure well.  Patient given post procedure instructions and IUD care card with expiration date.  Patient is asked to keep IUD strings tucked in her vagina until her postpartum follow up visit in 4-6 weeks. Patient advised to abstain from sexual intercourse and pulling on strings before her follow-up visit. Patient verbalized an understanding of the plan of care and agrees.   Pt added to the post-placental IUD insertion list and charges entered.  FChristin FudgeCNM, WHNP-BC 06/24/2022 6:56 AM   Signed: FChristin Fudge DNP,CNM 06/24/2022, 6:55 AM

## 2022-06-17 LAB — CERVICOVAGINAL ANCILLARY ONLY
Chlamydia: NEGATIVE
Comment: NEGATIVE
Comment: NORMAL
Neisseria Gonorrhea: NEGATIVE

## 2022-06-18 ENCOUNTER — Other Ambulatory Visit: Payer: Self-pay | Admitting: Advanced Practice Midwife

## 2022-06-18 ENCOUNTER — Other Ambulatory Visit: Payer: Self-pay

## 2022-06-18 DIAGNOSIS — O09523 Supervision of elderly multigravida, third trimester: Secondary | ICD-10-CM

## 2022-06-18 DIAGNOSIS — M3500 Sicca syndrome, unspecified: Secondary | ICD-10-CM

## 2022-06-18 DIAGNOSIS — O99213 Obesity complicating pregnancy, third trimester: Secondary | ICD-10-CM

## 2022-06-18 DIAGNOSIS — O3663X0 Maternal care for excessive fetal growth, third trimester, not applicable or unspecified: Secondary | ICD-10-CM

## 2022-06-18 DIAGNOSIS — O24414 Gestational diabetes mellitus in pregnancy, insulin controlled: Secondary | ICD-10-CM

## 2022-06-19 ENCOUNTER — Other Ambulatory Visit: Payer: Self-pay

## 2022-06-19 ENCOUNTER — Telehealth (HOSPITAL_COMMUNITY): Payer: Self-pay | Admitting: *Deleted

## 2022-06-19 ENCOUNTER — Ambulatory Visit: Payer: Medicaid Other | Attending: Obstetrics

## 2022-06-19 VITALS — BP 117/78 | HR 101 | Temp 98.1°F | Resp 18 | Ht 64.0 in | Wt 198.0 lb

## 2022-06-19 DIAGNOSIS — O09523 Supervision of elderly multigravida, third trimester: Secondary | ICD-10-CM | POA: Diagnosis not present

## 2022-06-19 DIAGNOSIS — O3663X0 Maternal care for excessive fetal growth, third trimester, not applicable or unspecified: Secondary | ICD-10-CM

## 2022-06-19 DIAGNOSIS — O99213 Obesity complicating pregnancy, third trimester: Secondary | ICD-10-CM

## 2022-06-19 DIAGNOSIS — M3500 Sicca syndrome, unspecified: Secondary | ICD-10-CM

## 2022-06-19 DIAGNOSIS — O321XX0 Maternal care for breech presentation, not applicable or unspecified: Secondary | ICD-10-CM | POA: Insufficient documentation

## 2022-06-19 DIAGNOSIS — O0990 Supervision of high risk pregnancy, unspecified, unspecified trimester: Secondary | ICD-10-CM

## 2022-06-19 DIAGNOSIS — O99891 Other specified diseases and conditions complicating pregnancy: Secondary | ICD-10-CM

## 2022-06-19 DIAGNOSIS — O403XX0 Polyhydramnios, third trimester, not applicable or unspecified: Secondary | ICD-10-CM | POA: Insufficient documentation

## 2022-06-19 DIAGNOSIS — Z3A36 36 weeks gestation of pregnancy: Secondary | ICD-10-CM | POA: Diagnosis not present

## 2022-06-19 DIAGNOSIS — E669 Obesity, unspecified: Secondary | ICD-10-CM

## 2022-06-19 DIAGNOSIS — O24414 Gestational diabetes mellitus in pregnancy, insulin controlled: Secondary | ICD-10-CM | POA: Diagnosis present

## 2022-06-19 NOTE — Telephone Encounter (Signed)
Preadmission screen  

## 2022-06-20 ENCOUNTER — Encounter: Payer: Self-pay | Admitting: Family Medicine

## 2022-06-20 ENCOUNTER — Ambulatory Visit (INDEPENDENT_AMBULATORY_CARE_PROVIDER_SITE_OTHER): Payer: Medicaid Other | Admitting: Family Medicine

## 2022-06-20 ENCOUNTER — Encounter (HOSPITAL_COMMUNITY): Payer: Self-pay | Admitting: *Deleted

## 2022-06-20 ENCOUNTER — Telehealth (HOSPITAL_COMMUNITY): Payer: Self-pay | Admitting: *Deleted

## 2022-06-20 VITALS — BP 113/73 | HR 96 | Wt 199.0 lb

## 2022-06-20 DIAGNOSIS — O24414 Gestational diabetes mellitus in pregnancy, insulin controlled: Secondary | ICD-10-CM

## 2022-06-20 DIAGNOSIS — O0990 Supervision of high risk pregnancy, unspecified, unspecified trimester: Secondary | ICD-10-CM

## 2022-06-20 DIAGNOSIS — O0993 Supervision of high risk pregnancy, unspecified, third trimester: Secondary | ICD-10-CM | POA: Diagnosis not present

## 2022-06-20 DIAGNOSIS — Z3A36 36 weeks gestation of pregnancy: Secondary | ICD-10-CM | POA: Diagnosis not present

## 2022-06-20 NOTE — Progress Notes (Signed)
ROB [redacted]w[redacted]d Pt has blood sugar log states sugars are much better.   CC: Baby's position is now breech. Pt had U/S yesterday.

## 2022-06-20 NOTE — Telephone Encounter (Signed)
Instructed to take half of prescribed insulin at bedtime the night before her version.  NPO and arrive at 0800.  Verbalized understanding and all questions answered.  Insulin plan received from Dr Ilda Basset.

## 2022-06-20 NOTE — Progress Notes (Signed)
   PRENATAL VISIT NOTE  Subjective:  Victoria Hobbs is a 42 y.o. [id number] at 57w4dbeing seen today for ongoing prenatal care.  She is currently monitored for the following issues for this high-risk pregnancy and has DOE (dyspnea on exertion); Sjogren syndrome with other organ involvement ([place]; Seborrhea capitis; Eczema; Allergic rhinitis; History of recurrent miscarriages; Bilateral dry eyes; Supervision of high risk pregnancy, antepartum; Uterine leiomyoma; History of gestational diabetes in prior pregnancy, currently pregnant; Asthma, well controlled; Nasal deviation; and Gestational diabetes mellitus in third trimester on their problem list.  Patient reports no complaints.  Contractions: Irritability. Vag. Bleeding: None.  Movement: Present. Denies leaking of fluid.   The following portions of the patient's history were reviewed and updated as appropriate: allergies, current medications, past family history, past medical history, past social history, past surgical history and problem list.   Objective:   Vitals:   06/20/22 0846  BP: 113/73  Pulse: 96  Weight: 199 lb (90.3 kg)    Fetal Status: Fetal Heart Rate (bpm): 138 Fundal Height: 37 cm Movement: Present  Presentation: Complete Breech  General:  Alert, oriented and cooperative. Patient is in no acute distress.  Skin: Skin is warm and dry. No rash noted.   Cardiovascular: Normal heart rate noted  Respiratory: Normal respiratory effort, no problems with respiration noted  Abdomen: Soft, gravid, appropriate for gestational age.  Pain/Pressure: Present     Pelvic: Cervical exam deferred        Extremities: Normal range of motion.  Edema: None  Mental Status: Normal mood and affect. Normal behavior. Normal judgment and thought content.   Assessment and Plan:  Pregnancy: [id number] 362w4d. Supervision of high risk pregnancy, antepartum Breech today- discussed ECV at 37 weeks and patient desires this. She strongly desires vaginal  birth. Reviewed r/b/a to ECV Scheduled ECV for Monday 1/8 with eckstat  2. Insulin controlled gestational diabetes mellitus (GDM) in third trimester LGA seems resolved-- on 1/4- EFW 3073, 67th%, AC 82nd%-- mild poly 1/3 Scheduled for delivery on 1/11   Preterm labor symptoms and general obstetric precautions including but not limited to vaginal bleeding, contractions, leaking of fluid and fetal movement were reviewed in detail with the patient. Please refer to After Visit Summary for other counseling recommendations.   Return in about 1 week (around 06/27/2022) for Routine prenatal care.  Future Appointments  Date Time Provider [place]/01[place]20[place]  8:00 AM [facility] [place] None  06/26/2022 12:00 AM [facility] SCHED ROOM [facility] None  06/26/2022 11:00 AM [facility]    KiCaren MacadamMD

## 2022-06-23 ENCOUNTER — Inpatient Hospital Stay (HOSPITAL_COMMUNITY)
Admission: RE | Admit: 2022-06-23 | Discharge: 2022-06-26 | DRG: 806 | Disposition: A | Discharge status: Home / Self Care | Payer: Medicaid Other | Attending: Obstetrics & Gynecology | Admitting: Obstetrics & Gynecology

## 2022-06-23 ENCOUNTER — Other Ambulatory Visit: Payer: Self-pay

## 2022-06-23 ENCOUNTER — Observation Stay (HOSPITAL_COMMUNITY): Payer: Medicaid Other

## 2022-06-23 ENCOUNTER — Encounter (HOSPITAL_COMMUNITY): Payer: Self-pay | Admitting: Obstetrics & Gynecology

## 2022-06-23 DIAGNOSIS — O3413 Maternal care for benign tumor of corpus uteri, third trimester: Secondary | ICD-10-CM | POA: Diagnosis present

## 2022-06-23 DIAGNOSIS — Z30430 Encounter for insertion of intrauterine contraceptive device: Secondary | ICD-10-CM | POA: Diagnosis not present

## 2022-06-23 DIAGNOSIS — O0990 Supervision of high risk pregnancy, unspecified, unspecified trimester: Secondary | ICD-10-CM

## 2022-06-23 DIAGNOSIS — Z3A37 37 weeks gestation of pregnancy: Secondary | ICD-10-CM

## 2022-06-23 DIAGNOSIS — M3509 Sicca syndrome with other organ involvement: Secondary | ICD-10-CM | POA: Diagnosis present

## 2022-06-23 DIAGNOSIS — R7303 Prediabetes: Secondary | ICD-10-CM | POA: Diagnosis present

## 2022-06-23 DIAGNOSIS — O9952 Diseases of the respiratory system complicating childbirth: Secondary | ICD-10-CM | POA: Diagnosis present

## 2022-06-23 DIAGNOSIS — O9912 Other diseases of the blood and blood-forming organs and certain disorders involving the immune mechanism complicating childbirth: Secondary | ICD-10-CM | POA: Diagnosis present

## 2022-06-23 DIAGNOSIS — J45909 Unspecified asthma, uncomplicated: Secondary | ICD-10-CM | POA: Diagnosis present

## 2022-06-23 DIAGNOSIS — R7309 Other abnormal glucose: Principal | ICD-10-CM | POA: Diagnosis present

## 2022-06-23 DIAGNOSIS — O24419 Gestational diabetes mellitus in pregnancy, unspecified control: Secondary | ICD-10-CM

## 2022-06-23 DIAGNOSIS — O99214 Obesity complicating childbirth: Secondary | ICD-10-CM | POA: Diagnosis present

## 2022-06-23 DIAGNOSIS — O24424 Gestational diabetes mellitus in childbirth, insulin controlled: Principal | ICD-10-CM | POA: Diagnosis present

## 2022-06-23 DIAGNOSIS — O403XX1 Polyhydramnios, third trimester, fetus 1: Secondary | ICD-10-CM | POA: Diagnosis not present

## 2022-06-23 DIAGNOSIS — D259 Leiomyoma of uterus, unspecified: Secondary | ICD-10-CM | POA: Diagnosis present

## 2022-06-23 DIAGNOSIS — O403XX0 Polyhydramnios, third trimester, not applicable or unspecified: Secondary | ICD-10-CM | POA: Diagnosis present

## 2022-06-23 DIAGNOSIS — E119 Type 2 diabetes mellitus without complications: Secondary | ICD-10-CM | POA: Diagnosis present

## 2022-06-23 LAB — GLUCOSE, CAPILLARY
Glucose-Capillary: 98 mg/dL (ref 70–99)
Glucose-Capillary: 140 mg/dL — ABNORMAL HIGH (ref 70–99)
Glucose-Capillary: 62 mg/dL — ABNORMAL LOW (ref 70–99)
Glucose-Capillary: 68 mg/dL — ABNORMAL LOW (ref 70–99)
Glucose-Capillary: 83 mg/dL (ref 70–99)
Glucose-Capillary: 102 mg/dL — ABNORMAL HIGH (ref 70–99)

## 2022-06-23 LAB — PROTEIN / CREATININE RATIO, URINE
Creatinine, Urine: 37 mg/dL
Protein Creatinine Ratio: 0.24 mg/mg{Cre} — ABNORMAL HIGH (ref 0.00–0.15)
Total Protein, Urine: 9 mg/dL

## 2022-06-23 LAB — CBC
HCT: 36.4 % (ref 36.0–46.0)
Hemoglobin: 12.5 g/dL (ref 12.0–15.0)
MCH: 30.3 pg (ref 26.0–34.0)
MCHC: 34.3 g/dL (ref 30.0–36.0)
MCV: 88.3 fL (ref 80.0–100.0)
Platelets: 207 10*3/uL (ref 150–400)
RBC: 4.12 MIL/uL (ref 3.87–5.11)
RDW: 13.4 % (ref 11.5–15.5)
WBC: 7.3 10*3/uL (ref 4.0–10.5)
nRBC: 0 % (ref 0.0–0.2)

## 2022-06-23 LAB — TYPE AND SCREEN
ABO/RH(D): B POS
Antibody Screen: NEGATIVE

## 2022-06-23 MED ORDER — LACTATED RINGERS IV SOLN
500.0000 mL | INTRAVENOUS | Status: DC | PRN
  Administered 2022-06-24: 500 mL via INTRAVENOUS
  Administered 2022-06-24: 1000 mL via INTRAVENOUS

## 2022-06-23 MED ORDER — TERBUTALINE SULFATE 1 MG/ML IJ SOLN
INTRAMUSCULAR | Status: AC
  Filled 2022-06-23: qty 1

## 2022-06-23 MED ORDER — SOD CITRATE-CITRIC ACID 500-334 MG/5ML PO SOLN: 30.0000 mL | ORAL | Status: DC | PRN

## 2022-06-23 MED ORDER — OXYTOCIN-SODIUM CHLORIDE 30-0.9 UT/500ML-% IV SOLN: 1.0000 m[IU]/min | INTRAVENOUS | Status: DC

## 2022-06-23 MED ORDER — FENTANYL CITRATE (PF) 100 MCG/2ML IJ SOLN: 50.0000 ug | INTRAMUSCULAR | Status: DC | PRN

## 2022-06-23 MED ORDER — TERBUTALINE SULFATE 1 MG/ML IJ SOLN: 0.2500 mg | Freq: Once | INTRAMUSCULAR | Status: DC

## 2022-06-23 MED ORDER — TERBUTALINE SULFATE 1 MG/ML IJ SOLN: 0.2500 mg | Freq: Once | INTRAMUSCULAR | Status: DC | PRN

## 2022-06-23 MED ORDER — OXYCODONE-ACETAMINOPHEN 5-325 MG PO TABS: 2.0000 | ORAL_TABLET | ORAL | Status: DC | PRN

## 2022-06-23 MED ORDER — LIDOCAINE HCL (PF) 1 % IJ SOLN: 30.0000 mL | INTRAMUSCULAR | Status: DC | PRN

## 2022-06-23 MED ORDER — OXYTOCIN-SODIUM CHLORIDE 30-0.9 UT/500ML-% IV SOLN: 2.5000 [IU]/h | INTRAVENOUS | Status: DC

## 2022-06-23 MED ORDER — HYDROXYCHLOROQUINE SULFATE 200 MG PO TABS
200.0000 mg | ORAL_TABLET | Freq: Two times a day (BID) | ORAL | Status: DC
  Administered 2022-06-23 – 2022-06-26 (×4): 200 mg via ORAL
  Filled 2022-06-23 (×8): qty 1

## 2022-06-23 MED ORDER — OXYTOCIN BOLUS FROM INFUSION
333.0000 mL | Freq: Once | INTRAVENOUS | Status: AC
  Administered 2022-06-24: 333 mL via INTRAVENOUS

## 2022-06-23 MED ORDER — MISOPROSTOL 50MCG HALF TABLET: 50.0000 ug | ORAL_TABLET | Freq: Once | ORAL | Status: DC

## 2022-06-23 MED ORDER — OXYTOCIN-SODIUM CHLORIDE 30-0.9 UT/500ML-% IV SOLN
2.5000 [IU]/h | INTRAVENOUS | Status: DC
  Filled 2022-06-23: qty 500

## 2022-06-23 MED ORDER — OXYCODONE-ACETAMINOPHEN 5-325 MG PO TABS: 1.0000 | ORAL_TABLET | ORAL | Status: DC | PRN

## 2022-06-23 MED ORDER — HYDROXYZINE HCL 50 MG PO TABS: 50.0000 mg | ORAL_TABLET | Freq: Four times a day (QID) | ORAL | Status: DC | PRN

## 2022-06-23 MED ORDER — FLEET ENEMA 7-19 GM/118ML RE ENEM: 1.0000 | ENEMA | Freq: Every day | RECTAL | Status: DC | PRN

## 2022-06-23 MED ORDER — ONDANSETRON HCL 4 MG/2ML IJ SOLN
4.0000 mg | Freq: Four times a day (QID) | INTRAMUSCULAR | Status: DC | PRN
  Administered 2022-06-24: 4 mg via INTRAVENOUS
  Filled 2022-06-23: qty 2

## 2022-06-23 MED ORDER — OXYTOCIN BOLUS FROM INFUSION: 333.0000 mL | Freq: Once | INTRAVENOUS | Status: DC

## 2022-06-23 MED ORDER — LACTATED RINGERS IV SOLN: INTRAVENOUS | Status: DC

## 2022-06-23 MED ORDER — LEVONORGESTREL 20 MCG/DAY IU IUD
1.0000 | INTRAUTERINE_SYSTEM | Freq: Once | INTRAUTERINE | Status: AC
  Administered 2022-06-24: 1 via INTRAUTERINE
  Filled 2022-06-23: qty 1

## 2022-06-23 MED ORDER — ONDANSETRON HCL 4 MG/2ML IJ SOLN: 4.0000 mg | Freq: Four times a day (QID) | INTRAMUSCULAR | Status: DC | PRN

## 2022-06-23 MED ORDER — ACETAMINOPHEN 325 MG PO TABS: 650.0000 mg | ORAL_TABLET | ORAL | Status: DC | PRN

## 2022-06-23 MED ORDER — ZOLPIDEM TARTRATE 5 MG PO TABS: 5.0000 mg | ORAL_TABLET | Freq: Every evening | ORAL | Status: DC | PRN

## 2022-06-23 MED ORDER — ACETAMINOPHEN 325 MG PO TABS
650.0000 mg | ORAL_TABLET | ORAL | Status: DC | PRN
  Administered 2022-06-24: 650 mg via ORAL
  Filled 2022-06-23: qty 2

## 2022-06-23 MED ORDER — OXYTOCIN-SODIUM CHLORIDE 30-0.9 UT/500ML-% IV SOLN
1.0000 m[IU]/min | INTRAVENOUS | Status: DC
  Administered 2022-06-23: 2 m[IU]/min via INTRAVENOUS
  Filled 2022-06-23: qty 500

## 2022-06-23 MED ORDER — LACTATED RINGERS IV SOLN: 500.0000 mL | INTRAVENOUS | Status: DC | PRN

## 2022-06-23 NOTE — Progress Notes (Signed)
Labor Progress Note  Victoria Hobbs is a 42 y.o. [id number] at 9w0dpresented for IOL GDMA2  S: Doing well, has eaten ready to start induction  O:  BP 126/70   Pulse 90   Temp 98.5 F (36.9 C) (Oral)   Resp 18   Ht '5\' 4"'$  (1.626 m)   Wt 90.7 kg   LMP 10/07/2021 (Exact Date)   BMI 34.33 kg/m  EFM: 140 bpm/Moderate variability/ 15x15 accels/ None decels  CVE: Dilation: 4 Effacement (%): 50 Station: -3 Presentation: Vertex Exam by:: Mercado-Ortiz, MD   A&P: 42y.o. [id number] for IOL as above  #Labor: Progressing well. Pitocin started 2x2. Requests Mirena IUD post placental #Pain: Nitrous oxide, Family/Friend support, and PO/IV pain meds #FWB: CAT 1 #GBS negative #A2GDM: CBG q4h, last one 98, well controlled, CTM  JeShelda PalDO FMOB Fellow, Faculty practice [facility] 06/23/22  12:07 PM

## 2022-06-23 NOTE — H&P (Addendum)
LABOR AND DELIVERY ADMISSION HISTORY AND PHYSICAL NOTE  Victoria Hobbs is a 42 y.o. female [phone] with IUP at 70w0dpresenting for ECV but is found to be vertex.   Patient reports the fetal movement as active. Patient reports uterine contraction  activity as intermittent braxton hicks.. Patient reports  vaginal bleeding as none. Patient describes fluid per vagina as None.   She plans on breast feeding and bottle feeding feeding. Her contraception plan is: IUD.  Prenatal History/Complications: [facility]  '@[redacted]w[redacted]d'$ , CWD, normal anatomy, breech presentation, anterior placenta, 67%ile, EFW 3073 grams  Pregnancy complications:  Patient Active Problem List   Diagnosis Date Noted   Gestational diabetes mellitus in third trimester 05/03/2022   History of gestational diabetes in prior pregnancy, currently pregnant 03/25/2022   Uterine leiomyoma 01/29/2022   Supervision of high risk pregnancy, antepartum 11/29/2021   Nasal deviation 08/27/2021   History of recurrent miscarriages 08/26/2021   Bilateral dry eyes 08/26/2021   DOE (dyspnea on exertion) 08/01/2021   Sjogren syndrome with other organ involvement (HSan Carlos 08/01/2021   Seborrhea capitis 07/06/2021   Eczema 07/06/2021   Allergic rhinitis 07/06/2021   Asthma, well controlled 07/06/2021    Past Medical History: Past Medical History:  Diagnosis Date   Asthma    Gestational diabetes    History of miscarriage 04/20/2021   Fourth miscarriage with 2 births out of 6   Palpitations    Precordial chest pain    a. 07/2021 Cor CTA: Ca2+ 0. Nl cors. No significant noncardiac findings.   Sjogren's syndrome (HChandler     Past Surgical History: Past Surgical History:  Procedure Laterality Date   BRAVO PSubletteSTUDY     CERVIX SURGERY     TONSILLECTOMY     UMBILICAL HERNIA REPAIR  2010    Obstetrical History: OB History     Gravida  7   Para  2   Term  2   Preterm      AB  4   Living  2      SAB  4   IAB       Ectopic      Multiple      Live Births  2           Social History: Social History   Socioeconomic History   Marital status: Married    Spouse name: CAlpa Salvo   Number of children: 2   Years of education: 16   Highest education level: Not on file  Occupational History   Occupation: homemaker  Tobacco Use   Smoking status: Never   Smokeless tobacco: Never  Vaping Use   Vaping Use: Never used  Substance and Sexual Activity   Alcohol use: Not Currently    Comment: 1 glass of wine every 4 months   Drug use: Never   Sexual activity: Yes    Partners: Male    Birth control/protection: Inserts  Other Topics Concern   Not on file  Social History Narrative   [place]  [place] been followed by cardiologist [place].   Social Determinants of Health   Financial Resource Strain: Low Risk  (11/29/2021)   Overall Financial Resource Strain (CARDIA)    Difficulty of Paying Living Expenses: Not very hard  Food Insecurity: No Food Insecurity (11/29/2021)   Hunger Vital Sign    Worried About Running Out of Food in the Last Year: Never true    Ran  Out of Food in the Last Year: Never true  Transportation Needs: No Transportation Needs (11/29/2021)   PRAPARE - Hydrologist (Medical): No    Lack of Transportation (Non-Medical): No  Physical Activity: Insufficiently Active (11/29/2021)   Exercise Vital Sign    Days of Exercise per Week: 1 day    Minutes of Exercise per Session: 60 min  Stress: No Stress Concern Present (11/29/2021)   [place]    Feeling of Stress : Not at all  Social Connections: Moderately Integrated (11/29/2021)   Social Connection and Isolation Panel [NHANES]    Frequency of Communication with Friends and Family: Once a week    Frequency of Social Gatherings with Friends and Family: More than three times a week    Attends  Religious Services: More than 4 times per year    Active Member of [employer] or Organizations: No    Attends Archivist Meetings: Never    Marital Status: Married    Family History: Family History  Problem Relation Age of Onset   Asthma Mother    Thyroid disease Sister    Sarcoidosis Brother    Crohn's disease Brother    Heart attack Maternal Grandmother     Allergies: Allergies  Allergen Reactions   Metronidazole Anaphylaxis and Hives    Other reaction(s): hives    Medications Prior to Admission  Medication Sig Dispense Refill Last Dose   albuterol (VENTOLIN HFA) 108 (90 Base) MCG/ACT inhaler 2 puffs every 6 (six) hours as needed.   Past Week   aspirin EC 81 MG tablet Take 162 mg by mouth once. Swallow whole.   06/22/2022   insulin glargine (LANTUS) 100 UNIT/ML Solostar Pen Inject 10 Units into the skin at bedtime. 15 mL 11 06/22/2022   Accu-Chek Softclix Lancets lancets 1 each by Other route 4 (four) times daily. 100 each 12    blood glucose meter kit and supplies KIT Dispense based on patient and insurance preference. Use up to four times daily as directed. 1 each 0    ferrous sulfate (FERROUSUL) 325 (65 FE) MG tablet Take 1 tablet (325 mg total) by mouth every other day. 30 tablet 3    glucose blood (ACCU-CHEK GUIDE) test strip Use to check blood sugars four times a day was instructed 50 each 12    hydroxychloroquine (PLAQUENIL) 200 MG tablet Take 200 mg by mouth 2 (two) times daily.      Insulin Pen Needle (PEN NEEDLES) 33G X 4 MM MISC Please use as directed 100 each 5    Prenatal Vit-Fe Fumarate-FA (MULTIVITAMIN-PRENATAL) 27-0.8 MG TABS tablet Take 1 tablet by mouth daily at 12 noon.      SYMBICORT 80-4.5 MCG/ACT inhaler Inhale 2 puffs into the lungs 2 (two) times daily as needed.      Wheat Dextrin (BENEFIBER PO) Take 1 capsule by mouth 2 (two) times daily.        Review of Systems  All systems reviewed and negative except as stated in HPI  Physical Exam BP 117/89    Pulse 68   LMP 10/07/2021 (Exact Date)   Physical Exam Constitutional:      General: She is not in acute distress.    Appearance: Normal appearance. She is not ill-appearing.  HENT:     Head: Atraumatic.  Eyes:     General: No scleral icterus.    Conjunctiva/sclera: Conjunctivae normal.  Pulmonary:  Effort: Pulmonary effort is normal.  Skin:    General: Skin is warm and dry.     Coloration: Skin is not jaundiced or pale.  Neurological:     Mental Status: She is alert.     Coordination: Coordination normal.  Psychiatric:        Mood and Affect: Mood normal.        Behavior: Behavior normal.     Presentation: cephalic by bedside US  Fetal monitoring: Baseline: 140 bpm, Variability: Good {> 6 bpm), Accelerations: Reactive, and Decelerations: Absent Uterine activity: irregular     Prenatal labs: ABO, Rh: B/Positive/-- (07/11 1028) Antibody: Negative (07/11 1028) Rubella: 4.67 (07/11 1028) RPR: Non Reactive (11/17 0908)  HBsAg: Negative (07/11 1028)  HIV: Non Reactive (11/17 0908)  GC/Chlamydia:  Neisseria Gonorrhea  Date Value Ref Range Status  06/13/2022 Negative  Final   Chlamydia  Date Value Ref Range Status  06/13/2022 Negative  Final   GBS: Negative/-- (12/29 1135)    Prenatal Transfer Tool  Maternal Diabetes: Yes:  Diabetes Type:  Insulin/Medication controlled Genetic Screening: Normal Maternal Ultrasounds/Referrals: Normal Fetal Ultrasounds or other Referrals:  Fetal echo-normal Maternal Substance Abuse:  No Significant Maternal Medications:  Meds include: Other: insulin, plaquenil Significant Maternal Lab Results: Group B Strep negative and Other: SSA Ab positive  Results for orders placed or performed during the hospital encounter of 06/23/22 (from the past 24 hour(s))  CBC   Collection Time: 06/23/22  7:59 AM  Result Value Ref Range   WBC 7.3 4.0 - 10.5 K/uL   RBC 4.12 3.87 - 5.11 MIL/uL   Hemoglobin 12.5 12.0 - 15.0 g/dL   HCT 36.4 36.0  - 46.0 %   MCV 88.3 80.0 - 100.0 fL   MCH 30.3 26.0 - 34.0 pg   MCHC 34.3 30.0 - 36.0 g/dL   RDW 13.4 11.5 - 15.5 %   Platelets 207 150 - 400 K/uL   nRBC 0.0 0.0 - 0.2 %  Glucose, capillary   Collection Time: 06/23/22  9:01 AM  Result Value Ref Range   Glucose-Capillary 98 70 - 99 mg/dL    Assessment: Victoria Hobbs is a 42 y.o. [id number] at 9w0dhere for ECV but found to be vertex. Scheduled for IOL in several days due to poorly controlled GDMA2. Given poorly controlled GDM and unstable lie at term, patient accepts admission today for IOL.   #Labor: pending cervical exam once transferred to L&D #Pain: IV pain meds PRN, epidural upon request #FHT: Category I #GBS/ID: Negative #MOF: breast feeding and bottle feeding #MOC:  post placental IUD #Circ: Yes  #Sjogren's syndrome: positive SSA Ab's, no fetal cardiac issues this pregnancy or on current FHT, [facility] Continue plaquenil.   #GDM: on lantus 10u QHS, given low requirments will monitor CBGs q4h latent labor, q2h active labor   MClarnce Flock MD/MPH Attending Family Medicine Physician, FRegency Hospital Of Fort Worthfor WHabers[facility] CLockhartGroup  06/23/2022, 9:31 AM

## 2022-06-23 NOTE — Progress Notes (Signed)
Labor Progress Note  Laberta Wilbon is a 42 y.o. T2I7124 at 55w0dpresented for IBoyce S: Doing well. Bouncing on the ball and has been walking around a lot.  O:  BP 119/66 (BP Location: Left Arm)   Pulse 84   Temp 97.9 F (36.6 C) (Oral)   Resp 20   Ht '5\' 4"'$  (1.626 m)   Wt 90.7 kg   LMP 10/07/2021 (Exact Date)   BMI 34.33 kg/m  EFM: 145bpm/Moderate variability/ 10x10 accels/ None decels  CVE: Dilation: 5 Effacement (%): 50 Station: -3 Presentation: Vertex Exam by:: Mercado-Ortiz, MD   A&P: 42y.o. [id number] for IOL as above  #Labor: Progressing well but cervix fairly posterior. AROM @ ~1730 with controlled descent of fetal head as it was still mildly ballotable. Contracting q2-3 minutes. #Pain: Nitrous oxide, Family/Friend support, and PO/IV pain meds #FWB: CAT 1 #GBS negative #A2GDM: CBG q4h, mildly low to 68>62 but increased appropriately with sugar intake, CTM   KaColletta MarylandMD 06/23/22  7:41 PM

## 2022-06-23 NOTE — Progress Notes (Signed)
Labor Progress Note  Victoria Hobbs is a 42 y.o. K3T2481 at 67w0dpresented for IOL GDMA2  S: Doing well, feeling her contractions more.   O:  BP 121/80   Pulse 86   Temp 98 F (36.7 C) (Oral)   Resp 18   Ht '5\' 4"'$  (1.626 m)   Wt 90.7 kg   LMP 10/07/2021 (Exact Date)   BMI 34.33 kg/m  EFM: 140 bpm/Moderate variability/ 15x15 accels/ None decels  CVE: Dilation: 5 Effacement (%): 50 Station: -3 Presentation: Vertex Exam by:: Mercado-Ortiz, MD   A&P: 42y.o. [id number] for IOL as above  #Labor: Progressing well. Pitocin ongoing, AROM not safe given fetal head very ballotable. Will recheck at next SVE #Pain: Nitrous oxide, Family/Friend support, and PO/IV pain meds #FWB: CAT 1 #GBS negative #A2GDM: CBG q4h, well controlled, CTM  JeShelda PalDO FMOB Fellow, Faculty practice CoHeritage Lakeor WoCalifornia [facility] 06/23/22  3:46 PM

## 2022-06-24 ENCOUNTER — Encounter (HOSPITAL_COMMUNITY): Payer: Self-pay | Admitting: Family Medicine

## 2022-06-24 ENCOUNTER — Inpatient Hospital Stay (HOSPITAL_COMMUNITY): Payer: Medicaid Other | Admitting: Anesthesiology

## 2022-06-24 DIAGNOSIS — O24424 Gestational diabetes mellitus in childbirth, insulin controlled: Secondary | ICD-10-CM

## 2022-06-24 DIAGNOSIS — Z30430 Encounter for insertion of intrauterine contraceptive device: Secondary | ICD-10-CM

## 2022-06-24 DIAGNOSIS — Z3A37 37 weeks gestation of pregnancy: Secondary | ICD-10-CM

## 2022-06-24 DIAGNOSIS — O403XX1 Polyhydramnios, third trimester, fetus 1: Secondary | ICD-10-CM

## 2022-06-24 LAB — GLUCOSE, CAPILLARY
Glucose-Capillary: 124 mg/dL — ABNORMAL HIGH (ref 70–99)
Glucose-Capillary: 110 mg/dL — ABNORMAL HIGH (ref 70–99)

## 2022-06-24 LAB — RPR: RPR Ser Ql: NONREACTIVE

## 2022-06-24 MED ORDER — SENNOSIDES-DOCUSATE SODIUM 8.6-50 MG PO TABS
2.0000 | ORAL_TABLET | ORAL | Status: DC
  Administered 2022-06-24 – 2022-06-26 (×3): 2 via ORAL
  Filled 2022-06-24 (×4): qty 2

## 2022-06-24 MED ORDER — PHENYLEPHRINE 80 MCG/ML (10ML) SYRINGE FOR IV PUSH (FOR BLOOD PRESSURE SUPPORT): 80.0000 ug | PREFILLED_SYRINGE | INTRAVENOUS | Status: DC | PRN

## 2022-06-24 MED ORDER — METHYLERGONOVINE MALEATE 0.2 MG/ML IJ SOLN: 0.2000 mg | INTRAMUSCULAR | Status: DC | PRN

## 2022-06-24 MED ORDER — METHYLERGONOVINE MALEATE 0.2 MG PO TABS: 0.2000 mg | ORAL_TABLET | ORAL | Status: DC | PRN

## 2022-06-24 MED ORDER — BENZOCAINE-MENTHOL 20-0.5 % EX AERO
1.0000 | INHALATION_SPRAY | CUTANEOUS | Status: DC | PRN
  Administered 2022-06-24: 1 via TOPICAL
  Filled 2022-06-24: qty 56

## 2022-06-24 MED ORDER — COCONUT OIL OIL: 1.0000 | TOPICAL_OIL | Status: DC | PRN

## 2022-06-24 MED ORDER — IBUPROFEN 600 MG PO TABS
600.0000 mg | ORAL_TABLET | Freq: Four times a day (QID) | ORAL | Status: DC
  Administered 2022-06-24 – 2022-06-26 (×8): 600 mg via ORAL
  Filled 2022-06-24 (×9): qty 1

## 2022-06-24 MED ORDER — BISACODYL 10 MG RE SUPP: 10.0000 mg | Freq: Every day | RECTAL | Status: DC | PRN

## 2022-06-24 MED ORDER — EPHEDRINE 5 MG/ML INJ: 10.0000 mg | INTRAVENOUS | Status: DC | PRN

## 2022-06-24 MED ORDER — ONDANSETRON HCL 4 MG PO TABS: 4.0000 mg | ORAL_TABLET | ORAL | Status: DC | PRN

## 2022-06-24 MED ORDER — PRENATAL MULTIVITAMIN CH
1.0000 | ORAL_TABLET | Freq: Every day | ORAL | Status: DC
  Administered 2022-06-25 – 2022-06-26 (×2): 1 via ORAL
  Filled 2022-06-24 (×2): qty 1

## 2022-06-24 MED ORDER — DIPHENHYDRAMINE HCL 25 MG PO CAPS: 25.0000 mg | ORAL_CAPSULE | Freq: Four times a day (QID) | ORAL | Status: DC | PRN

## 2022-06-24 MED ORDER — ACETAMINOPHEN 325 MG PO TABS
650.0000 mg | ORAL_TABLET | ORAL | Status: DC | PRN
  Administered 2022-06-24: 650 mg via ORAL
  Filled 2022-06-24: qty 2

## 2022-06-24 MED ORDER — FLEET ENEMA 7-19 GM/118ML RE ENEM: 1.0000 | ENEMA | Freq: Every day | RECTAL | Status: DC | PRN

## 2022-06-24 MED ORDER — LIDOCAINE HCL (PF) 1 % IJ SOLN
INTRAMUSCULAR | Status: DC | PRN
  Administered 2022-06-24: 5 mL via EPIDURAL
  Administered 2022-06-24: 4 mL via EPIDURAL

## 2022-06-24 MED ORDER — FERROUS SULFATE 325 (65 FE) MG PO TABS
325.0000 mg | ORAL_TABLET | ORAL | Status: DC
  Administered 2022-06-24 – 2022-06-26 (×2): 325 mg via ORAL
  Filled 2022-06-24 (×2): qty 1

## 2022-06-24 MED ORDER — WITCH HAZEL-GLYCERIN EX PADS: 1.0000 | MEDICATED_PAD | CUTANEOUS | Status: DC | PRN

## 2022-06-24 MED ORDER — MEDROXYPROGESTERONE ACETATE 150 MG/ML IM SUSP: 150.0000 mg | INTRAMUSCULAR | Status: DC | PRN

## 2022-06-24 MED ORDER — DIBUCAINE (PERIANAL) 1 % EX OINT: 1.0000 | TOPICAL_OINTMENT | CUTANEOUS | Status: DC | PRN

## 2022-06-24 MED ORDER — SIMETHICONE 80 MG PO CHEW: 80.0000 mg | CHEWABLE_TABLET | ORAL | Status: DC | PRN

## 2022-06-24 MED ORDER — LACTATED RINGERS IV SOLN
500.0000 mL | Freq: Once | INTRAVENOUS | Status: AC
  Administered 2022-06-24: 500 mL via INTRAVENOUS

## 2022-06-24 MED ORDER — DIPHENHYDRAMINE HCL 50 MG/ML IJ SOLN: 12.5000 mg | INTRAMUSCULAR | Status: DC | PRN

## 2022-06-24 MED ORDER — FENTANYL-BUPIVACAINE-NACL 0.5-0.125-0.9 MG/250ML-% EP SOLN
12.0000 mL/h | EPIDURAL | Status: DC | PRN
  Administered 2022-06-24: 12 mL/h via EPIDURAL
  Filled 2022-06-24: qty 250

## 2022-06-24 MED ORDER — MEASLES, MUMPS & RUBELLA VAC IJ SOLR: 0.5000 mL | Freq: Once | INTRAMUSCULAR | Status: DC

## 2022-06-24 MED ORDER — TETANUS-DIPHTH-ACELL PERTUSSIS 5-2.5-18.5 LF-MCG/0.5 IM SUSY: 0.5000 mL | PREFILLED_SYRINGE | Freq: Once | INTRAMUSCULAR | Status: DC

## 2022-06-24 MED ORDER — ONDANSETRON HCL 4 MG/2ML IJ SOLN: 4.0000 mg | INTRAMUSCULAR | Status: DC | PRN

## 2022-06-24 NOTE — Anesthesia Preprocedure Evaluation (Addendum)
Anesthesia Evaluation  Patient identified by MRN, date of birth, ID band Patient awake    Reviewed: Allergy & Precautions, NPO status , Patient's Chart, lab work & pertinent test results  History of Anesthesia Complications Negative for: history of anesthetic complications  Airway Mallampati: II   Neck ROM: Full    Dental   Pulmonary asthma    Pulmonary exam normal        Cardiovascular negative cardio ROS Normal cardiovascular exam     Neuro/Psych negative neurological ROS  negative psych ROS   GI/Hepatic negative GI ROS, Neg liver ROS,,,  Endo/Other  diabetes, Gestational, Insulin Dependent   Obesity   Renal/GU negative Renal ROS     Musculoskeletal negative musculoskeletal ROS (+)    Abdominal   Peds  Hematology negative hematology ROS (+)   Anesthesia Other Findings Sjogren's syndrome   Reproductive/Obstetrics (+) Pregnancy                             Anesthesia Physical Anesthesia Plan  ASA: 2  Anesthesia Plan: Epidural   Post-op Pain Management:    Induction:   PONV Risk Score and Plan: 2 and Treatment may vary due to age or medical condition  Airway Management Planned: Natural Airway  Additional Equipment: None  Intra-op Plan:   Post-operative Plan:   Informed Consent: I have reviewed the patients History and Physical, chart, labs and discussed the procedure including the risks, benefits and alternatives for the proposed anesthesia with the patient or authorized representative who has indicated his/her understanding and acceptance.       Plan Discussed with: Anesthesiologist  Anesthesia Plan Comments: (Labs reviewed. Platelets acceptable, patient not taking any blood thinning medications. Per RN, FHR tracing reported to be stable enough for sitting procedure. Risks and benefits discussed with patient, including PDPH, backache, epidural hematoma, failed  epidural, blood pressure changes, allergic reaction, and nerve injury. Patient expressed understanding and wished to proceed.)       Anesthesia Quick Evaluation

## 2022-06-24 NOTE — Anesthesia Procedure Notes (Signed)
Epidural Patient location during procedure: OB Start time: 06/24/2022 2:32 AM End time: 06/24/2022 2:35 AM  Staffing Anesthesiologist: Audry Pili, MD Performed: anesthesiologist   Preanesthetic Checklist Completed: patient identified, IV checked, risks and benefits discussed, monitors and equipment checked, pre-op evaluation and timeout performed  Epidural Patient position: sitting Prep: DuraPrep Patient monitoring: continuous pulse ox and blood pressure Approach: midline Location: L2-L3 Injection technique: LOR saline  Needle:  Needle type: Tuohy  Needle gauge: 17 G Needle length: 9 cm Needle insertion depth: 4.5 cm Catheter size: 19 Gauge Catheter at skin depth: 9 cm Test dose: negative and Other (1% lidocaine)  Assessment Events: blood not aspirated and no cerebrospinal fluid  Additional Notes Patient identified. Risks including, but not limited to, bleeding, infection, nerve damage, paralysis, inadequate analgesia, blood pressure changes, nausea, vomiting, allergic reaction, postpartum back pain, itching, and headache were discussed. Patient expressed understanding and wished to proceed. Sterile prep and drape, including hand hygiene, mask, and sterile gloves were used. The patient was positioned and the spine was prepped. The skin was anesthetized with lidocaine. No paraesthesia or other complication noted. The patient did not experience any signs of intravascular injection such as tinnitus or metallic taste in mouth, nor signs of intrathecal spread such as rapid motor block. Please see nursing notes for vital signs. The patient tolerated the procedure well.   Renold Don, MDReason for block:procedure for pain

## 2022-06-24 NOTE — Discharge Summary (Shared)
Postpartum Discharge Summary  Date of Service updated***     Patient Name: Victoria Hobbs DOB: [birth year] MRN: [id number]  Date of admission: 06/23/2022 Delivery date:06/24/2022  Delivering provider: Christin Fudge  Date of discharge: 06/24/2022  Admitting diagnosis: Poor glycemic control [R73.09] Normal labor [O80, Z37.9] Intrauterine pregnancy: [redacted]w[redacted]d    Secondary diagnosis:  Principal Problem:   Poor glycemic control Active Problems:   Supervision of high risk pregnancy, antepartum   Asthma, well controlled   Gestational diabetes mellitus in third trimester   Normal labor   Vaginal delivery   Encounter for IUD insertion  Additional problems: none    Discharge diagnosis: Term Pregnancy Delivered and GDM A2                                              Post partum procedures:IUD insertion Augmentation: AROM and Pitocin Complications: None  Hospital course: Induction of Labor With Vaginal Delivery   42y.o. yo GW2O3785at 36w1das admitted to the hospital 06/23/2022 for induction of labor.  Indication for induction: A2 DM.  Patient had an labor course complicated by nothing Membrane Rupture Time/Date: 7:32 PM ,06/23/2022   Delivery Method:Vaginal, Spontaneous  Episiotomy: None  Lacerations:  None  Details of delivery can be found in separate delivery note.  Patient had a postpartum course complicated by***. Patient is discharged home 06/24/22.  Newborn Data: Birth date:06/24/2022  Birth time:6:16 AM  Gender:Female  Living status:Living  Apgars:8 ,9  Weight:   Magnesium Sulfate received: No BMZ received: No Rhophylac:N/A MMR:N/A T-DaP:Given prenatally Flu: No Transfusion:{Transfusion received:30440034}  Physical exam  Vitals:   06/24/22 0351 06/24/22 0400 06/24/22 0631 06/24/22 0646  BP:  122/70 127/75 125/60  Pulse:  (!) 101 98 99  Resp:    20  Temp: 98.8 F (37.1 C)   98.2 F (36.8 C)  TempSrc: Oral   Oral  SpO2:      Weight:      Height:        General: {Exam; general:21111117} Lochia: {Desc; appropriate/inappropriate:30686::"appropriate"} Uterine Fundus: {Desc; firm/soft:30687} Incision: {Exam; incision:21111123} DVT Evaluation: {Exam; dvt:2111122} Labs: Lab Results  Component Value Date   WBC 7.3 06/23/2022   HGB 12.5 06/23/2022   HCT 36.4 06/23/2022   MCV 88.3 06/23/2022   PLT 207 06/23/2022      Latest Ref Rng & Units 12/08/2021    8:25 AM  CMP  Glucose 70 - 99 mg/dL 121   BUN 6 - 20 mg/dL <5   Creatinine 0.44 - 1.00 mg/dL 0.57   Sodium 135 - 145 mmol/L 135   Potassium 3.5 - 5.1 mmol/L 3.6   Chloride 98 - 111 mmol/L 107   CO2 22 - 32 mmol/L 23   Calcium 8.9 - 10.3 mg/dL 9.2    Edinburgh Score:    11/29/2021    1:30 PM  Edinburgh Postnatal Depression Scale Screening Tool  I have been able to laugh and see the funny side of things. 0  I have looked forward with enjoyment to things. 0  I have blamed myself unnecessarily when things went wrong. 0  I have been anxious or worried for no good reason. 0  I have felt scared or panicky for no good reason. 0  Things have been getting on top of me. 3  I have been so unhappy that I have had difficulty  sleeping. 0  I have felt sad or miserable. 0  I have been so unhappy that I have been crying. 0  The thought of harming myself has occurred to me. 0  Edinburgh Postnatal Depression Scale Total 3     After visit meds:  Allergies as of 06/24/2022       Reactions   Metronidazole Anaphylaxis, Hives   Other reaction(s): hives     Med Rec must be completed prior to using this [facility]***        Discharge home in stable condition Infant Feeding: {Baby feeding:23562} Infant Disposition:{CHL IP OB HOME WITH ONGEXB:28413} Discharge instruction: per After Visit Summary and Postpartum booklet. Activity: Advance as tolerated. Pelvic rest for 6 weeks.  Diet: {OB KGMW:10272536} Future Appointments: Future Appointments  Date Time Provider [place]   06/26/2022 11:00 AM [facility]   Follow up Visit:   Please schedule this patient for a In person postpartum visit in 4 weeks with the following provider: Any provider. Additional Postpartum F/U:2 hour GTT  Low risk pregnancy complicated by: GDM Delivery mode:  Vaginal, Spontaneous  Anticipated Birth Control:  PP IUD placed   06/24/2022 Christin Fudge, CNM

## 2022-06-24 NOTE — Progress Notes (Signed)
MD called to clarify POCT order that states to do a POCT check every 4 hours. She stated to do an [facility] before dinner check and if that's normal we don't have to do any more POCT tests.

## 2022-06-24 NOTE — Progress Notes (Signed)
Coping well w/ctx.  FHR cat 1.  Ctx q 3 minutes.  Pitocin at 8 mu/min. Continue present mg.t

## 2022-06-24 NOTE — Anesthesia Postprocedure Evaluation (Cosign Needed)
Anesthesia Post Note  Patient: [name]  Procedure(s) Performed: AN AD [place]     Patient location during evaluation: Mother Baby Anesthesia Type: Epidural Level of consciousness: oriented and awake and alert Pain management: pain level controlled Vital Signs Assessment: post-procedure vital signs reviewed and stable Respiratory status: spontaneous breathing, respiratory function stable and nonlabored ventilation Cardiovascular status: blood pressure returned to baseline and stable Postop Assessment: no headache, no backache, able to ambulate, patient able to bend at knees, no apparent nausea or vomiting and adequate PO intake Anesthetic complications: no   No notable events documented.  Last Vitals:  Vitals:   06/24/22 0936 06/24/22 1030  BP: 97/62 102/69  Pulse: (!) 105 (!) 102  Resp: 18 18  Temp: 36.7 C 36.7 C  SpO2: 99%     Last Pain:  Vitals:   06/24/22 0930  TempSrc:   PainSc: 4    Pain Goal:                   [name]

## 2022-06-24 NOTE — Lactation Note (Signed)
This note was copied from a baby's chart. Lactation Consultation Note  Patient Name: Victoria Hobbs UMPNT'I Date: 06/24/2022 Reason for consult: Initial assessment;Early term 37-38.6wks Age:40 hours   P3: Early term infant at 37+1 weeks Feeding preference: Breast/formula  "Victoria Hobbs" was beginning to arouse when I arrived.  Mother reported that she has not yet latched since delivery.  Offered to assist with waking and latching; mother receptive.  Taught hand expression; no drops obtained.  Demonstrated finger feeding and encouraged mother to finger feed any expressed drops to "Victoria Hobbs."  Assisted to latch easily, however, baby was not at all interested in initiating a suck; pushed back off the breast and became fussy.  Placed her STS on mother's chest and she fell asleep.  Provided breast shells and a manual pump to help evert nipples.  Placed shells in mother's sports bra with directions for use.  #24 flange is appropriate at this time and instructed to pre-pump prior to latching to help with nipple eversion.  Suggested mother feed at least every three hours or sooner if "Victoria Hobbs" shows cues.  She will call her RN/LC for latch assistance as needed.  Father present.   Maternal Data Has patient been taught Hand Expression?: Yes Does the patient have breastfeeding experience prior to this delivery?: Yes How long did the patient breastfeed?: Approximately 5 months with each of her other two children  Feeding Mother's Current Feeding Choice: Breast Milk and Formula  LATCH Score Latch: Too sleepy or reluctant, no latch achieved, no sucking elicited.  Audible Swallowing: None  Type of Nipple: Everted at rest and after stimulation (Short shafted)  Comfort (Breast/Nipple): Soft / non-tender  Hold (Positioning): Assistance needed to correctly position infant at breast and maintain latch.  LATCH Score: 5   Lactation Tools Discussed/Used    Interventions Interventions: Breast  feeding basics reviewed;Assisted with latch;Skin to skin;Breast massage;Hand express;Pre-pump if needed;Breast compression;Hand pump;Shells;Position options;Support pillows;Adjust position;Education;LC Services brochure  Discharge Pump: Personal  Consult Status Consult Status: Follow-up Date: 06/25/22 Follow-up type: In-patient    Kamarrion Stfort R Latreshia Beauchaine 06/24/2022, 11:15 AM

## 2022-06-25 LAB — BIRTH TISSUE RECOVERY COLLECTION (PLACENTA DONATION)

## 2022-06-25 MED ORDER — IBUPROFEN 600 MG PO TABS: 600.0000 mg | ORAL_TABLET | Freq: Four times a day (QID) | ORAL | 0 refills | Status: DC

## 2022-06-25 MED ORDER — ACETAMINOPHEN 325 MG PO TABS: 650.0000 mg | ORAL_TABLET | ORAL | 0 refills | Status: DC | PRN

## 2022-06-25 NOTE — Progress Notes (Signed)
POSTPARTUM PROGRESS NOTE  Post Partum Day 1  Subjective:  Victoria Hobbs is a 42 y.o. [id number] s/p VD at [redacted]w[redacted]d  She reports she is doing well. No acute events overnight. She denies any problems with ambulating, voiding or po intake. Denies nausea or vomiting.  Pain is well controlled.  Lochia is appropriate.  Objective: Blood pressure (!) 110/58, pulse 71, temperature 98.3 F (36.8 C), temperature source Oral, resp. rate 16, height '5\' 4"'$  (1.626 m), weight 90.7 kg, last menstrual period 10/07/2021, SpO2 98 %, unknown if currently breastfeeding.  Physical Exam:  General: alert, cooperative and no distress Chest: no respiratory distress Heart:regular rate, distal pulses intact Abdomen: soft, nontender,  Uterine Fundus: firm, appropriately tender DVT Evaluation: No calf swelling or tenderness Extremities: No LE edema Skin: warm, dry  Recent Labs    06/23/22 0759  HGB 12.5  HCT 36.4    Assessment/Plan: Victoria Scollardis a 42y.o. [id number]/p VD at 356w1d PPD#1 - Doing well  Routine postpartum care Contraception: ppIUD placed Feeding: Both Dispo: Plan for discharge 1/11.   LOS: 2 days   SiGerlene FeeDO OB Fellow, [place]/03/2023, 12:24 PM

## 2022-06-25 NOTE — Lactation Note (Addendum)
This note was copied from a baby's chart. Lactation Consultation Note  Patient Name: Victoria Hobbs VKFMM'C Date: 06/25/2022 Age: 40 hours old  Reason for consult: Follow-up assessment;Early term 37-38.6wks;Infant weight loss;Maternal endocrine disorder (2 % weight loss, per mom baby is sleepier during the day. LC reviewed and updated the doc flow sheets per parents. Lars Mage has been Br/ Formula.) Baby has had 1 wet in her life and 4 stools.  LC reviewed BF D/C teaching and [place] resources.   Feeding Mother's Current Feeding Choice: Breast Milk and Formula Nipple Type: Slow - flow  LATCH Score - last latch score 8    Lactation Tools Discussed/Used  Hand pump  Interventions Interventions: Breast feeding basics reviewed;Hand pump;Education;LC Services brochure  Discharge Discharge Education: Engorgement and breast care Pump: Personal;Hands Free;Manual  Consult Status Consult Status: Complete Date: 06/25/22    Jerlyn Ly Mete Purdum 06/25/2022, 11:00 AM

## 2022-06-25 NOTE — Lactation Note (Signed)
This note was copied from a baby's chart. Lactation Consultation Note  Patient Name: Victoria Hobbs YIRSW'N Date: 06/25/2022 Age : 40 hours  2nd LC note for today  Reason for consult: Follow-up assessment (per [facility] , baby's d/C being held until tomorrow for Echo and EKG .)   Maternal Data    Feeding Mother's Current Feeding Choice: Breast Milk and Formula Nipple Type: Slow - flow     Interventions Interventions: Breast feeding basics reviewed;Hand pump;Education;LC Services brochure  Discharge Discharge Education: Engorgement and breast care Pump: Personal;Hands Free;Manual  Consult Status Consult Status: Follow-up Date: 06/25/22 Follow-up type: In-patient    [place] 06/25/2022, 12:25 PM

## 2022-06-26 ENCOUNTER — Inpatient Hospital Stay (HOSPITAL_COMMUNITY): Payer: Medicaid Other

## 2022-06-26 ENCOUNTER — Inpatient Hospital Stay (HOSPITAL_COMMUNITY)
Admission: RE | Admit: 2022-06-26 | Payer: Medicaid Other | Source: Home / Self Care | Admitting: Obstetrics & Gynecology

## 2022-06-26 ENCOUNTER — Other Ambulatory Visit: Payer: Medicaid Other

## 2022-06-26 NOTE — Lactation Note (Signed)
This note was copied from a baby's chart. Lactation Consultation Note  Patient Name: Girl Becky Colan TGYBW'L Date: 06/26/2022 Reason for consult: Follow-up assessment Age:40 hours  P3, [redacted]w[redacted]d  Mother is breastfeeding and supplementing with formula.  Mother knows to wake baby for feedings if needed. Reviewed engorgement care and monitoring voids/stools. Mother denies questions or concerns.   Maternal Data Does the patient have breastfeeding experience prior to this delivery?: Yes  Feeding Mother's Current Feeding Choice: Breast Milk and Formula  Interventions Interventions: Education  Discharge Discharge Education: Warning signs for feeding baby;Engorgement and breast care  Consult Status Consult Status: Complete Date: 06/26/22    BVivianne MasterBMargaret R. [facility]1/04/2023, 11:34 AM

## 2022-06-26 NOTE — Discharge Summary (Signed)
Postpartum Discharge Summary     Patient Name: Victoria Hobbs DOB: 09/11/1980 MRN: [id number]  Date of admission: 06/23/2022 Delivery date:06/24/2022  Delivering provider: Christin Hobbs  Date of discharge: 06/26/2022  Admitting diagnosis: Poor glycemic control [R73.09] Normal labor [O80, Z37.9] Intrauterine pregnancy: [redacted]w[redacted]d    Secondary diagnosis:  Principal Problem:   Poor glycemic control Active Problems:   Supervision of high risk pregnancy, antepartum   Asthma, well controlled   Gestational diabetes mellitus in third trimester   Normal labor   Vaginal delivery   Encounter for IUD insertion  Additional problems: none    Discharge diagnosis: Term Pregnancy Delivered and GDM A2                                              Post partum procedures:IUD insertion Augmentation: AROM and Pitocin Complications: None  Hospital course: Induction of Labor With Vaginal Delivery   42y.o. yo [name] admitted to the hospital 06/23/2022 for induction of labor.  Indication for induction: A2 DM.  Patient had an labor course complicated by nothing Membrane Rupture Time/Date: 7:32 PM ,06/23/2022   Delivery Method:Vaginal, Spontaneous  Episiotomy: None  Lacerations:  None  Details of delivery can be found in separate delivery note.  Patient had a postpartum course complicated by mildly elevated CBG but not requiring continued use of insulin. Patient is discharged home 06/26/22.  Newborn Data: Birth date:06/24/2022  Birth time:6:16 AM  Gender:Female  Living status:Living  Apgars:8 ,9  Weight:3400 g   Magnesium Sulfate received: No BMZ received: No Rhophylac:N/A MMR:N/A T-DaP:Given prenatally Flu: No Transfusion:No  Physical exam  Vitals:   06/25/22 0359 06/25/22 1444 06/25/22 2126 06/26/22 0558  BP: (!) 110/58 118/82 109/84 127/75  Pulse: 71 70 73 69  Resp: '16 17 16 18  '$ Temp: 98.3 F (36.8 C) 98 F (36.7 C) 98.2 F (36.8 C) 98.2 F (36.8 C)  TempSrc:  Oral Tympanic Oral   SpO2: 98%  100% 100%  Weight:      Height:       General: alert, cooperative, and no distress Lochia: appropriate Uterine Fundus: firm Incision: N/A DVT Evaluation: No evidence of DVT seen on physical exam. Labs: Lab Results  Component Value Date   WBC 7.3 06/23/2022   HGB 12.5 06/23/2022   HCT 36.4 06/23/2022   MCV 88.3 06/23/2022   PLT 207 06/23/2022      Latest Ref Rng & Units 12/08/2021    8:25 AM  CMP  Glucose 70 - 99 mg/dL 121   BUN 6 - 20 mg/dL <5   Creatinine 0.44 - 1.00 mg/dL 0.57   Sodium 135 - 145 mmol/L 135   Potassium 3.5 - 5.1 mmol/L 3.6   Chloride 98 - 111 mmol/L 107   CO2 22 - 32 mmol/L 23   Calcium 8.9 - 10.3 mg/dL 9.2    Edinburgh Score:    06/24/2022    2:48 PM  Edinburgh Postnatal Depression Scale Screening Tool  I have been able to laugh and see the funny side of things. 0  I have looked forward with enjoyment to things. 0  I have blamed myself unnecessarily when things went wrong. 1  I have been anxious or worried for no good reason. 0  I have felt scared or panicky for no good reason. 0  Things have been  getting on top of me. 1  I have been so unhappy that I have had difficulty sleeping. 0  I have felt sad or miserable. 0  I have been so unhappy that I have been crying. 0  The thought of harming myself has occurred to me. 0  Edinburgh Postnatal Depression Scale Total 2     After visit meds:  Allergies as of 06/26/2022       Reactions   Metronidazole Anaphylaxis, Hives   Shellfish Allergy Shortness Of Breath, Itching, Rash   Latex Rash        Medication List     STOP taking these medications    Accu-Chek Guide test strip Generic drug: glucose blood   Accu-Chek Softclix Lancets lancets   aspirin EC 81 MG tablet   blood glucose meter kit and supplies Kit   insulin glargine 100 UNIT/ML Solostar Pen Commonly known as: LANTUS   Pen Needles 33G X 4 MM Misc       TAKE these medications     acetaminophen 325 MG tablet Commonly known as: Tylenol Take 2 tablets (650 mg total) by mouth every 4 (four) hours as needed (for pain scale < 4).   albuterol 108 (90 Base) MCG/ACT inhaler Commonly known as: VENTOLIN HFA Inhale 2 puffs into the lungs as needed for wheezing or shortness of breath.   BENEFIBER PO Take 3 capsules by mouth 2 (two) times daily.   ferrous sulfate 325 (65 FE) MG tablet Commonly known as: FerrouSul Take 1 tablet (325 mg total) by mouth every other day.   hydroxychloroquine 200 MG tablet Commonly known as: PLAQUENIL Take 200 mg by mouth at bedtime.   ibuprofen 600 MG tablet Commonly known as: ADVIL Take 1 tablet (600 mg total) by mouth every 6 (six) hours.   multivitamin-prenatal 27-0.8 MG Tabs tablet Take 1 tablet by mouth daily at 12 noon.   REFRESH OP Place 2 drops into both eyes at bedtime.   Symbicort 80-4.5 MCG/ACT inhaler Generic drug: budesonide-formoterol Inhale 2 puffs into the lungs 2 (two) times daily as needed (SOB/wheezing).   TUMS PO Take 3 tablets by mouth as needed (heartburn).   VISINE OP Place 2 drops into both eyes as needed (dry eye).        Discharge home in stable condition Infant Feeding: Bottle and Breast Infant Disposition:home with mother Discharge instruction: per After Visit Summary and Postpartum booklet. Activity: Advance as tolerated. Pelvic rest for 6 weeks.  Diet: routine diet Future Appointments: Future Appointments  Date Time Provider [place]  08/05/2022  9:00 AM [facility]  08/05/2022  9:35 AM Victoria Hobbs [facility]   Follow up Visit:   Please schedule this patient for a In person postpartum visit in 4 weeks with the following provider: Any provider. Additional Postpartum F/U:2 hour GTT  Low risk pregnancy complicated by: GDM Delivery mode:  Vaginal, Spontaneous  Anticipated Birth Control:  PP IUD placed   06/26/2022 Victoria Goodnow Autry-Lott,  DO

## 2022-06-27 ENCOUNTER — Encounter: Payer: Medicaid Other | Admitting: Family Medicine

## 2022-07-01 ENCOUNTER — Telehealth (HOSPITAL_COMMUNITY): Payer: Self-pay

## 2022-07-01 NOTE — Telephone Encounter (Signed)
Patient reports feeling good. Patient declines questions/concerns about her health and healing.  Patient reports that baby is doing well. Eating, peeing/pooping, and gaining weight well. Patient has questions regarding how to know if baby is getting enough milk while nursing. RN explained about doing a weighted feed to see how much milk baby is transferring off the breast. RN reviewed information about the breastfeeding support group and will e-mail this information to patient. Baby sleeps in a bassinet. RN reviewed ABC's of safe sleep with patient. Patient declines any questions or concerns about baby.  EPDS score is 1.  Sharyn Lull [facility] 07/01/22,1450

## 2022-07-04 ENCOUNTER — Encounter: Payer: Medicaid Other | Admitting: Family Medicine

## 2022-07-10 ENCOUNTER — Other Ambulatory Visit: Payer: Medicaid Other

## 2022-07-15 ENCOUNTER — Encounter: Payer: Medicaid Other | Admitting: Obstetrics and Gynecology

## 2022-07-31 ENCOUNTER — Telehealth: Payer: Self-pay | Admitting: Licensed Clinical Social Worker

## 2022-07-31 NOTE — Telephone Encounter (Signed)
-----   Message from Kieth Brightly sent at 07/31/2022  4:01 PM EST ----- Regarding: mh referral Evening [place] all continues to go well in your Friday Eve.  Hey when you get a minute would you please check in with this newly pp mom.  She reports no current concerns but stated that she did experience pp depression with her middle child.  Thanks,

## 2022-08-05 ENCOUNTER — Other Ambulatory Visit: Payer: Medicaid Other

## 2022-08-05 ENCOUNTER — Ambulatory Visit: Payer: Medicaid Other | Admitting: Obstetrics & Gynecology

## 2022-08-11 NOTE — Telephone Encounter (Signed)
TELEHEALTH VIRTUAL MOOD VISIT ENCOUNTER NOTE  I connected with Victoria Hobbs on 08/11/22 by telephone and verified that I am speaking with the correct person using two identifiers.   I discussed the limitations, risks, security and privacy concerns of performing an evaluation and management service by telephone and the availability of in person appointments. I also discussed with the patient that there may be a patient responsible charge related to this service. The patient expressed understanding and agreed to proceed.  LCSW introduced self and established psychological safety.  Patient verbally consented to this telephonic session.   Patient is located at home LCSW located at remote work site  Time visit started: 3:28pm  Time visit ended: 3:58pm  SUBJECTIVE: Patient clinic or provider recommended a 2 week mood check due to risk factors for postpartum mood disorder.  Patient delivered on 06/24/2022.   ASSESSMENT: Patient currently experiencing moments of sadness and overwhelm, appetite is increasing currently, had been low for 4 weeks with no appetite, breastfeeding and supplementing, breastfeeding has been overwhelming and feeling frustrated and is thinking about discontinuing; sleeping 2-4 hours in a 24 hours, does get out and has some support from her husband and her sister-in-law. 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 Patient reports she is very self-aware of her needs and knows when to ask for help and when to take breaks.   PRESENTING CONCERNS: Patient and/or family reports the following symptoms/concerns: moments of low mood, frustration, overwhelm. She reports that she is able to manage symptoms by taking breaks with support from her husband, when needed.   Duration of problem: 4 weeks;  Severity of problem: mild  STRENGTHS (Protective Factors/Coping Skills): Patient has some support from both her husband and sister-in-law and she reports that she is communicating her needs better at this time. Patient reports that she is very self-aware and knows when she needs a break and either gets support from her husband or places children in a safe place and takes a quite moment to herself.   INTERVENTIONS: Interventions utilized:  Psychoeducation and/or Health Education Standardized Assessments completed: Patient declined screening  Conducted brief assessment  Provide brief psychoeducation on postpartum mood and anxiety disorders signs and symptoms, including information on Baby Blues, Postpartum Depression, and Postpartum Anxiety.   Discussed self-care strategies to prevent and/or reduce symptoms of postpartum mood and anxiety disorders, including sleep hygiene, eating regularly, drinking fluids, getting outside, and support from partner, family, and/or friends.   Informed patient to call her provider right away or get emergency help if she experiences any of the following symptoms: Feelings of hopelessness and total despair. Feeling out of touch with reality (hearing or seeing things other people don't). Feeling that you might hurt yourself or your baby.  PLAN: Behavioral recommendations: Patient is encouraged to engage in therapy. Patient declines at this time but did accept LCSW's contact information.   Milton Ferguson

## 2022-08-14 ENCOUNTER — Other Ambulatory Visit: Payer: Medicaid Other

## 2022-08-14 ENCOUNTER — Encounter: Payer: Self-pay | Admitting: Obstetrics and Gynecology

## 2022-08-14 ENCOUNTER — Ambulatory Visit (INDEPENDENT_AMBULATORY_CARE_PROVIDER_SITE_OTHER): Payer: Medicaid Other | Admitting: Obstetrics and Gynecology

## 2022-08-14 VITALS — BP 126/80 | HR 66 | Wt 183.0 lb

## 2022-08-14 DIAGNOSIS — Z30431 Encounter for routine checking of intrauterine contraceptive device: Secondary | ICD-10-CM

## 2022-08-14 DIAGNOSIS — O24419 Gestational diabetes mellitus in pregnancy, unspecified control: Secondary | ICD-10-CM

## 2022-08-14 DIAGNOSIS — Z975 Presence of (intrauterine) contraceptive device: Secondary | ICD-10-CM

## 2022-08-14 DIAGNOSIS — O24439 Gestational diabetes mellitus in the puerperium, unspecified control: Secondary | ICD-10-CM

## 2022-08-14 NOTE — Progress Notes (Signed)
    [place] Partum Visit Note  Cortni Newvine is a 42 y.o. [id number] s/p SVD/intact perineum on 1/9 at 37wks for poorly controlled GDM.  Anesthesia: Epidural. Postpartum course has been uncomplicated. Baby is doing well. Baby is feeding by breast. Bleeding no bleeding. Bowel function is normal. Bladder function is normal. Patient is not sexually active. Contraception method: Mirena placed immediately postpartum. Postpartum depression screening: negative.  Pap and HPV neg July 2023   Edinburgh Postnatal Depression Scale - 08/14/22 0819       Edinburgh Postnatal Depression Scale:  In the Past 7 Days   I have been able to laugh and see the funny side of things. 0    I have looked forward with enjoyment to things. 0    I have blamed myself unnecessarily when things went wrong. 0    I have been anxious or worried for no good reason. 0    I have felt scared or panicky for no good reason. 0    Things have been getting on top of me. 0    I have been so unhappy that I have had difficulty sleeping. 0    I have felt sad or miserable. 0    I have been so unhappy that I have been crying. 0    The thought of harming myself has occurred to me. 0    Edinburgh Postnatal Depression Scale Total 0             Review of Systems A comprehensive review of systems was negative.  Objective:  BP 126/80   Pulse 66   Wt 183 lb (83 kg)   Breastfeeding Yes   BMI 31.41 kg/m    General: NAD EGBUS wnl. Strings 3cm out of the introitus Vagina: wnl Cervix: wnl, strings trimmed to 3-4cm  Assessment:   Normal postpartum exam  Plan:  *Postpartum: routine care. Repeat pap in 3 years. Patient has PCP ([facility] in [place]), and I told her to call them for a routine physical in 2-3 months *GDM: f/u GTT today  Aletha Halim, [place] for [employer], [place]

## 2022-08-15 LAB — GLUCOSE TOLERANCE, 2 HOURS
Glucose, 2 hour: 85 mg/dL (ref 70–139)
Glucose, GTT - Fasting: 100 mg/dL — ABNORMAL HIGH (ref 70–99)

## 2022-08-17 ENCOUNTER — Encounter: Payer: Self-pay | Admitting: Obstetrics and Gynecology

## 2022-11-04 ENCOUNTER — Ambulatory Visit: Payer: Medicaid Other | Admitting: Obstetrics and Gynecology

## 2022-11-04 ENCOUNTER — Encounter: Payer: Self-pay | Admitting: Obstetrics and Gynecology

## 2022-11-04 VITALS — BP 115/75 | HR 70 | Wt 182.0 lb

## 2022-11-04 DIAGNOSIS — Z30431 Encounter for routine checking of intrauterine contraceptive device: Secondary | ICD-10-CM

## 2022-11-04 NOTE — Progress Notes (Signed)
42 yo P3 here for IUD check. IUD placed following delivery of placenta on 06/24/22. Strings were trimmed on 08/14/22. Patient presents today stating that her partner can feel the strings during intercourse. 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 07/2021 Cor CTA: Ca2+ 0. Nl cors. No significant noncardiac findings.   Sjogren's syndrome (HCC)    Past Surgical History:  Procedure Laterality Date   BRAVO PH STUDY     CERVIX SURGERY     TONSILLECTOMY     UMBILICAL HERNIA REPAIR  2010   Family History  Problem Relation Age of Onset   Asthma Mother    Thyroid disease Sister    Sarcoidosis Brother    Crohn's disease Brother    Heart attack Maternal Grandmother    Social History   Tobacco Use   Smoking status: Never   Smokeless tobacco: Never  Vaping Use   Vaping Use: Never used  Substance Use Topics   Alcohol use: Not Currently    Comment: 1 glass of wine every 4 months   Drug use: Never   ROS See pertinent in HPI. All other systems reviewed and non contributory Blood pressure 115/75, pulse 70, weight 182 lb (82.6 kg), currently breastfeeding. GENERAL: Well-developed, well-nourished female in no acute distress.  PELVIC: Normal external female genitalia. Vagina is pink and rugated.  Normal discharge. Normal appearing cervix with IUD strings extending 3 cm from os. Uterus is normal in size. No adnexal mass or tenderness. Chaperone present during the pelvic exam EXTREMITIES: No cyanosis, clubbing, or edema, 2+ distal pulses.  A/P 42 yo P3 here for IUD check Strings trimmed to 1.5 cm and tucked into the posterior fornix Patient with normal pap smear  12/2021 RTC prn

## 2022-11-04 NOTE — Progress Notes (Signed)
CC: String check, Pt stating that strings are pointy ?   Mirena placed PP 06/23/22

## 2022-11-15 LAB — HM DIABETES EYE EXAM

## 2023-07-14 ENCOUNTER — Other Ambulatory Visit: Payer: Self-pay | Admitting: Physician Assistant

## 2023-07-14 DIAGNOSIS — Z1231 Encounter for screening mammogram for malignant neoplasm of breast: Secondary | ICD-10-CM

## 2023-07-27 IMAGING — CT CT HEART MORP W/ CTA COR W/ SCORE W/ CA W/CM &/OR W/O CM
2 of 10 series · 8 of 20 positions shown, 10 images · non-contrast
Comparison: None.

Addendum:
CLINICAL DATA: Chest pain

EXAM:
Cardiac/Coronary  CTA
TECHNIQUE: The patient was scanned on a Siemens Somatom go.Top scanner.

[Series 28: multiphase % cta coronary 0.60 · axial · 0.33mm/px · z∈[-1131,-1049]mm · 5 of 3708 slices shown, 7 images]
[im 618/3708  vessel]
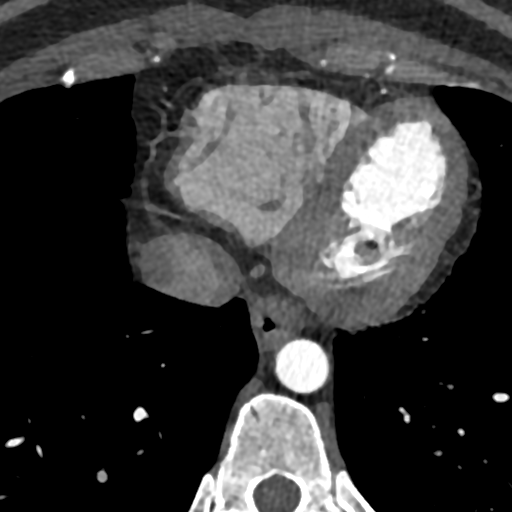
[im 618/3708  lung]
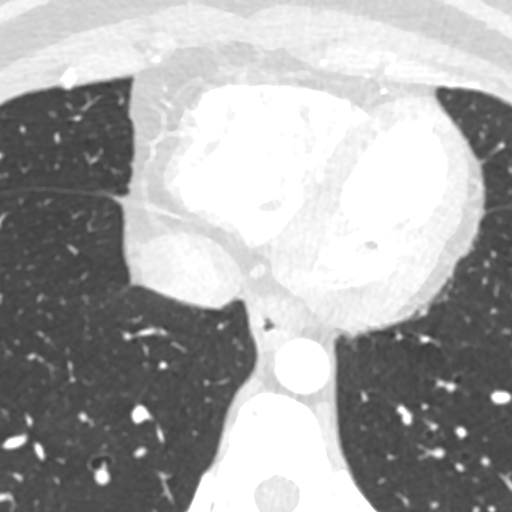
[im 1236/3708  vessel]
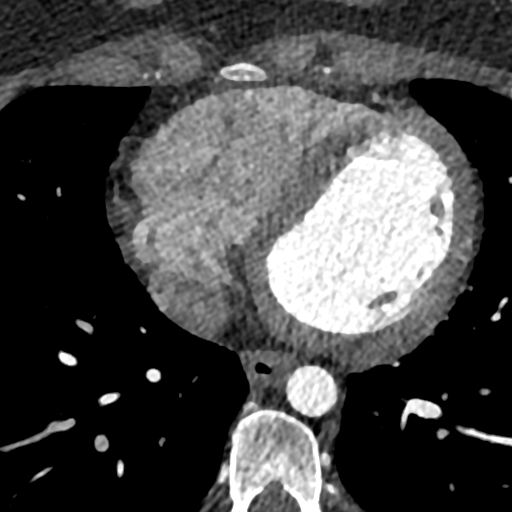
[im 1854/3708  vessel]
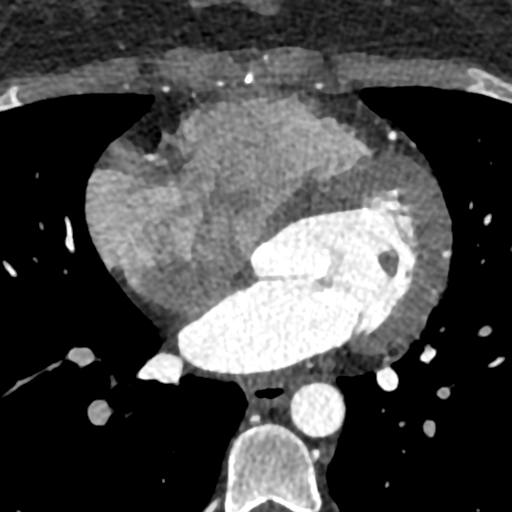
[im 2472/3708  vessel]
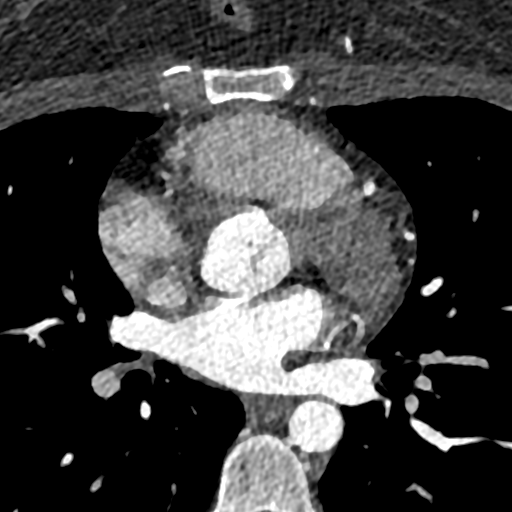
[im 3090/3708  vessel]
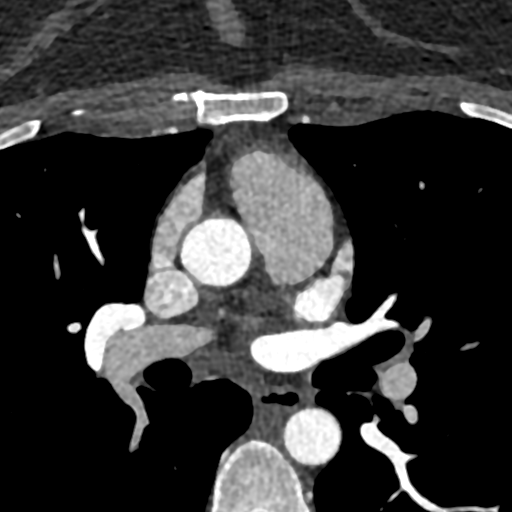
[im 3090/3708  lung]
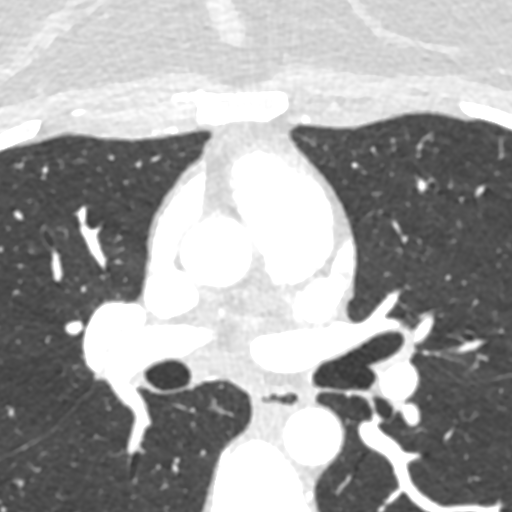

[Series 36: ms multiphase cta coronary 0.60 · axial · 0.33mm/px · z∈[-1121,-1059]mm · 3 of 2472 slices shown]
[im 618/2472  vessel]
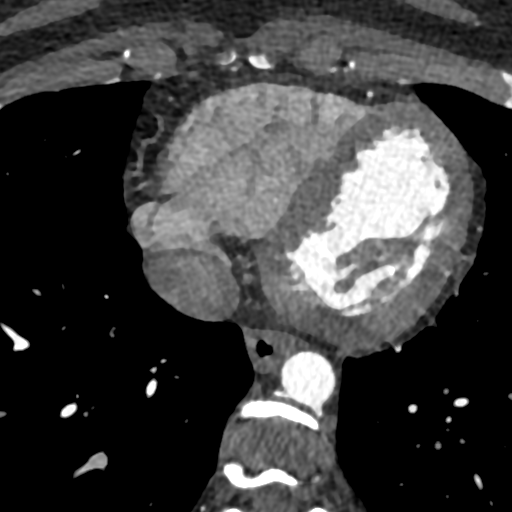
[im 1236/2472  vessel]
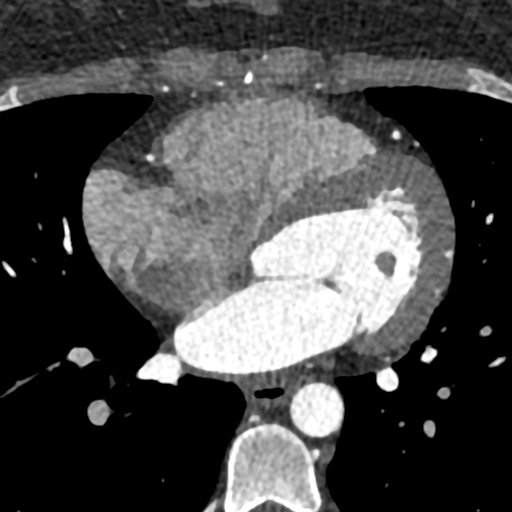
[im 1854/2472  vessel]
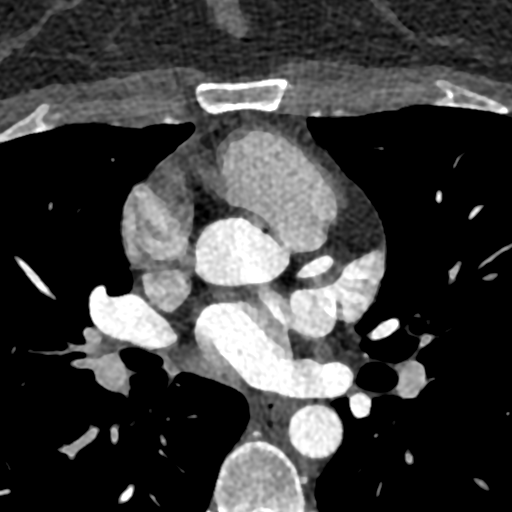

[8 of 20 positions shown; findings below may reference images not displayed]



Aortic Valve:  Trileaflet.  No calcifications.

Coronary Arteries:  Normal coronary origin.  Right dominance.

RCA is a dominant artery that gives rise to PDA and PLA. There is no
plaque.

Left main gives rise to LAD and LCX arteries. There is no LM
disease.

LAD has no plaque.

LCX is a non-dominant artery that gives rise to one OM1 branch.
There is no plaque.

Other findings:

Normal pulmonary vein drainage into the left atrium.

Normal left atrial appendage without a thrombus.

Normal size of the pulmonary artery.
IMPRESSION: 1. Coronary calcium score of 0. Patient is low risk for coronary
events.

2. Normal coronary origin with right dominance.

3. No evidence of CAD.

4. CAD-RADS 0. Consider non-atherosclerotic causes of chest pain.

EXAM:
OVER-READ INTERPRETATION  CT CHEST

The following report is an over-read performed by radiologist Dr.
does not include interpretation of cardiac or coronary anatomy or
pathology. The coronary calcium score/coronary CTA interpretation by
the cardiologist is attached.
FINDINGS: Images of the upper abdomen are unremarkable. Visualized mediastinal
structures are normal. 3 mm calcified granuloma in the left upper
lobe on sequence 14, image 11. Otherwise, the visualized lungs are
clear. No acute bone abnormality.
IMPRESSION: No acute extracardiac findings.



Aortic Valve:  Trileaflet.  No calcifications.

Coronary Arteries:  Normal coronary origin.  Right dominance.

RCA is a dominant artery that gives rise to PDA and PLA. There is no
plaque.

Left main gives rise to LAD and LCX arteries. There is no LM
disease.

LAD has no plaque.

LCX is a non-dominant artery that gives rise to one OM1 branch.
There is no plaque.

Other findings:

Normal pulmonary vein drainage into the left atrium.

Normal left atrial appendage without a thrombus.

Normal size of the pulmonary artery.
IMPRESSION: 1. Coronary calcium score of 0. Patient is low risk for coronary
events.

2. Normal coronary origin with right dominance.

3. No evidence of CAD.

4. CAD-RADS 0. Consider non-atherosclerotic causes of chest pain.

## 2023-07-30 ENCOUNTER — Encounter: Payer: Self-pay | Admitting: Obstetrics and Gynecology

## 2023-07-30 ENCOUNTER — Ambulatory Visit: Payer: Medicaid Other | Admitting: Obstetrics and Gynecology

## 2023-07-30 VITALS — BP 118/71 | HR 73 | Wt 189.0 lb

## 2023-07-30 DIAGNOSIS — R7303 Prediabetes: Secondary | ICD-10-CM

## 2023-07-30 DIAGNOSIS — Z30432 Encounter for removal of intrauterine contraceptive device: Secondary | ICD-10-CM | POA: Diagnosis not present

## 2023-07-30 HISTORY — PX: IUD REMOVAL: OBO 1004

## 2023-07-30 NOTE — Procedures (Signed)
Intrauterine Device (IUD) Removal Procedure Note  Patient desires to conceive  Prior to the procedure being performed, the patient (or guardian) was asked to state their full name, date of birth, and the type of procedure being performed. EGBUS normal. Vaginal vault normal. Cervix normal with IUD strings seen (approx 3-4cm in length). Strings grasped with ringed forceps and easily removed and noted to be intact.   No complications, patient tolerated the procedure well. Patient already on a vitamin. H/o poorly controlled GDM and pre-diabetes on postpartum GTT a year ago and no recent check, per patient. Will check A1c today  Cornelia Copa MD Attending [facility] [employer] (Faculty Practice) 07/30/2023

## 2023-07-31 LAB — HEMOGLOBIN A1C
Est. average glucose Bld gHb Est-mCnc: 160 mg/dL
Hgb A1c MFr Bld: 7.2 % — ABNORMAL HIGH (ref 4.8–5.6)

## 2023-08-03 ENCOUNTER — Ambulatory Visit
Admission: RE | Admit: 2023-08-03 | Discharge: 2023-08-03 | Disposition: A | Discharge status: Home / Self Care | Payer: Medicaid Other | Source: Ambulatory Visit

## 2023-08-03 ENCOUNTER — Encounter: Payer: Self-pay | Admitting: Obstetrics and Gynecology

## 2023-08-03 DIAGNOSIS — Z1231 Encounter for screening mammogram for malignant neoplasm of breast: Secondary | ICD-10-CM

## 2023-08-18 NOTE — Progress Notes (Unsigned)
 New Patient Office Visit  Subjective    Patient ID: Victoria Hobbs, female    DOB: 11-19-1980  Age: 43 y.o. MRN: [id number]  CC: No chief complaint on file.   HPI Victoria Hobbs is a 43 y.o. female presents to establish care.   Previous PCP/physical/labs:   Autoimmune disorder:   Gestational diabetes:    ***  Outpatient Encounter Medications as of 08/19/2023  Medication Sig   albuterol (VENTOLIN HFA) 108 (90 Base) MCG/ACT inhaler Inhale 2 puffs into the lungs as needed for wheezing or shortness of breath.   ibuprofen (ADVIL) 600 MG tablet Take 1 tablet (600 mg total) by mouth every 6 (six) hours. (Patient not taking: Reported on 08/14/2022)   levonorgestrel (MIRENA) 20 MCG/DAY IUD 1 each by Intrauterine route once.   Prenatal Vit-Fe Fumarate-FA (MULTIVITAMIN-PRENATAL) 27-0.8 MG TABS tablet Take 1 tablet by mouth daily at 12 noon.   SYMBICORT 80-4.5 MCG/ACT inhaler Inhale 2 puffs into the lungs 2 (two) times daily as needed (SOB/wheezing).   No facility-administered encounter medications on file as of 08/19/2023.    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 07/2021 Cor CTA: Ca2+ 0. Nl cors. No significant noncardiac findings.   Sjogren's syndrome (HCC)    Uterine leiomyoma 01/29/2022   2 and 3 cm      Past Surgical History:  Procedure Laterality Date   BRAVO PH STUDY     CERVIX SURGERY     IUD REMOVAL  07/30/2023   TONSILLECTOMY     UMBILICAL HERNIA REPAIR  2010    Family History  Problem Relation Age of Onset   Asthma Mother    Thyroid disease Sister    Sarcoidosis Brother    Crohn's disease Brother    Heart attack Maternal Grandmother     Social History   Socioeconomic  History   Marital status: Married    Spouse name: Nakyia Dau.   Number of children: 2   Years of education: 16   Highest education level: Associate degree: occupational, Scientist, product/process development, or vocational program  Occupational History   Occupation: homemaker  Tobacco Use   Smoking status: Never   Smokeless tobacco: Never  Vaping Use   Vaping status: Never Used  Substance and Sexual Activity   Alcohol use: Not Currently    Comment: 1 glass of wine every 4 months   Drug use: Never   Sexual activity: Yes    Partners: Male    Birth control/protection: Inserts  Other Topics Concern   Not on file  Social History Narrative   Moved from [place] about a year ago.  Had previously been followed by cardiologist there.   Social Drivers of Corporate investment banker Strain: Low Risk  (08/18/2023)   Overall Financial Resource Strain (CARDIA)    Difficulty of Paying Living Expenses: Not hard at all  Food Insecurity: No Food Insecurity (08/18/2023)   Hunger Vital Sign    Worried About Running Out of Food in the Last Year: Never true    Ran Out of Food in the Last Year: Never true  Transportation Needs: No Transportation Needs (08/18/2023)   PRAPARE - Transportation    Lack  of Transportation (Medical): No    Lack of Transportation (Non-Medical): No  Physical Activity: Insufficiently Active (08/18/2023)   Exercise Vital Sign    Days of Exercise per Week: 2 days    Minutes of Exercise per Session: 20 min  Stress: No Stress Concern Present (08/18/2023)   [employer] of Occupational Health - Occupational Stress Questionnaire    Feeling of Stress : Not at all  Social Connections: Socially Integrated (08/18/2023)   Social Connection and Isolation Panel [NHANES]    Frequency of Communication with Friends and Family: Three times a week    Frequency of Social Gatherings with Friends and Family: Once a week    Attends Religious Services: More than 4 times per year    Active Member of [employer] or  Organizations: Yes    Attends Banker Meetings: 1 to 4 times per year    Marital Status: Married  Catering manager Violence: Unknown (01/01/2023)   Received from [facility]   HITS    Physically Hurt: Not on file    Insult or Talk Down To: Not on file    Threaten Physical Harm: Not on file    Scream or Curse: Not on file    ROS      Objective    There were no vitals taken for this visit.  Physical Exam  {Labs (Optional):23779}    Assessment & Plan:  There are no diagnoses linked to this encounter.   No follow-ups on file.   Modesto Charon, NP

## 2023-08-19 ENCOUNTER — Encounter: Payer: Self-pay | Admitting: General Practice

## 2023-08-19 ENCOUNTER — Ambulatory Visit: Admitting: General Practice

## 2023-08-19 VITALS — BP 110/70 | HR 78 | Temp 98.5°F | Ht 64.5 in | Wt 188.0 lb

## 2023-08-19 DIAGNOSIS — E1165 Type 2 diabetes mellitus with hyperglycemia: Secondary | ICD-10-CM | POA: Diagnosis not present

## 2023-08-19 DIAGNOSIS — Z7689 Persons encountering health services in other specified circumstances: Secondary | ICD-10-CM | POA: Diagnosis not present

## 2023-08-19 DIAGNOSIS — Z7984 Long term (current) use of oral hypoglycemic drugs: Secondary | ICD-10-CM

## 2023-08-19 DIAGNOSIS — M3509 Sicca syndrome with other organ involvement: Secondary | ICD-10-CM

## 2023-08-19 LAB — COMPREHENSIVE METABOLIC PANEL
ALT: 33 U/L (ref 0–35)
AST: 28 U/L (ref 0–37)
Albumin: 4.8 g/dL (ref 3.5–5.2)
Alkaline Phosphatase: 72 U/L (ref 39–117)
BUN: 15 mg/dL (ref 6–23)
CO2: 29 meq/L (ref 19–32)
Calcium: 9.6 mg/dL (ref 8.4–10.5)
Chloride: 103 meq/L (ref 96–112)
Creatinine, Ser: 0.73 mg/dL (ref 0.40–1.20)
GFR: 102.35 mL/min (ref >60.00)
Glucose, Bld: 108 mg/dL — ABNORMAL HIGH (ref 70–99)
Potassium: 3.9 meq/L (ref 3.5–5.1)
Sodium: 138 meq/L (ref 135–145)
Total Bilirubin: 0.7 mg/dL (ref 0.2–1.2)
Total Protein: 8.1 g/dL (ref 6.0–8.3)

## 2023-08-19 LAB — CBC
HCT: 40.1 % (ref 36.0–46.0)
Hemoglobin: 13.4 g/dL (ref 12.0–15.0)
MCHC: 33.4 g/dL (ref 30.0–36.0)
MCV: 88.7 fl (ref 78.0–100.0)
Platelets: 256 10*3/uL (ref 150.0–400.0)
RBC: 4.51 Mil/uL (ref 3.87–5.11)
RDW: 13.7 % (ref 11.5–15.5)
WBC: 6.3 10*3/uL (ref 4.0–10.5)

## 2023-08-19 LAB — MICROALBUMIN / CREATININE URINE RATIO
Creatinine,U: 153.9 mg/dL
Microalb Creat Ratio: 24.8 mg/g (ref 0.0–30.0)
Microalb, Ur: 3.8 mg/dL — ABNORMAL HIGH (ref 0.0–1.9)

## 2023-08-19 LAB — LIPID PANEL
Cholesterol: 150 mg/dL (ref 0–200)
HDL: 43.5 mg/dL (ref >39.00)
LDL Cholesterol: 95 mg/dL (ref 0–99)
NonHDL: 106.74
Total CHOL/HDL Ratio: 3
Triglycerides: 58 mg/dL (ref 0.0–149.0)
VLDL: 11.6 mg/dL (ref 0.0–40.0)

## 2023-08-19 MED ORDER — METFORMIN HCL ER 500 MG PO TB24: 500.0000 mg | ORAL_TABLET | Freq: Every day | ORAL | 0 refills | Status: DC

## 2023-08-19 NOTE — Patient Instructions (Addendum)
 Stop by the lab prior to leaving today. I will notify you of your results once received.   Call Dr. Dimple Casey- Rheumatology and schedule follow up.   Start Metformin 500 mg once daily with breakfast.   Start checking your blood sugar levels.  Appropriate times to check your blood sugar levels are:  -Before any meal (breakfast, lunch, dinner) -Two hours after any meal (breakfast, lunch, dinner) -Bedtime  Record your readings and notify me if you continue to consistently run at or above 150.    Follow up in 3 months for diabetes, foot exam and pneumonia vaccine (at the health department).

## 2023-08-19 NOTE — Assessment & Plan Note (Signed)
 EMR reviewed briefly.

## 2023-08-19 NOTE — Assessment & Plan Note (Addendum)
 Uncontrolled with a hemoglobin A1c of 7.2 on 07/30/2023.  Discussed the importance of monitoring diet and increasing exercise.  Discussed pneumonia vaccine and statin.  She will consider pneumonia vaccine and will schedule the health department.  No statin at this time as she would like to conceive soon.  Discussed foot care and treatment options.  She does not want to do GLP-1 at this time as she would like to conceive soon.  Agreeable to start metformin XR 500 mg once daily with breakfast.  Rx sent.  CBC, CMP, urine ACR, lipid panel pending.  Follow-up in 3 months for foot exam and repeat hemoglobin A1c.

## 2023-08-19 NOTE — Assessment & Plan Note (Signed)
 Controlled.  Recommended to schedule a rheumatology follow-up.

## 2023-08-20 MED ORDER — ACCU-CHEK SOFTCLIX LANCETS MISC: 12 refills | Status: AC

## 2023-08-26 MED ORDER — ACCU-CHEK GUIDE TEST VI STRP
ORAL_STRIP | 12 refills | Status: AC
Start: 2023-08-26 — End: ?

## 2023-08-26 NOTE — Addendum Note (Signed)
 Addended by: Wendie Simmer B on: 08/26/2023 10:21 AM   Modules accepted: Orders

## 2023-09-04 ENCOUNTER — Encounter: Payer: Self-pay | Admitting: General Practice

## 2023-09-13 ENCOUNTER — Other Ambulatory Visit: Payer: Self-pay | Admitting: Obstetrics and Gynecology

## 2023-09-13 MED ORDER — SLYND 4 MG PO TABS: 1.0000 | ORAL_TABLET | Freq: Every day | ORAL | 3 refills | Status: DC

## 2023-09-15 ENCOUNTER — Ambulatory Visit: Payer: Medicaid Other | Admitting: General Practice

## 2023-11-14 ENCOUNTER — Other Ambulatory Visit: Payer: Self-pay | Admitting: General Practice

## 2023-11-14 DIAGNOSIS — E1165 Type 2 diabetes mellitus with hyperglycemia: Secondary | ICD-10-CM

## 2023-11-17 ENCOUNTER — Encounter: Payer: Self-pay | Admitting: General Practice

## 2023-11-17 ENCOUNTER — Ambulatory Visit: Admitting: General Practice

## 2023-11-17 VITALS — BP 118/66 | HR 78 | Temp 98.2°F | Ht 64.5 in | Wt 180.0 lb

## 2023-11-17 DIAGNOSIS — Z7984 Long term (current) use of oral hypoglycemic drugs: Secondary | ICD-10-CM | POA: Diagnosis not present

## 2023-11-17 DIAGNOSIS — H5789 Other specified disorders of eye and adnexa: Secondary | ICD-10-CM | POA: Insufficient documentation

## 2023-11-17 DIAGNOSIS — J4520 Mild intermittent asthma, uncomplicated: Secondary | ICD-10-CM | POA: Diagnosis not present

## 2023-11-17 DIAGNOSIS — E1165 Type 2 diabetes mellitus with hyperglycemia: Secondary | ICD-10-CM

## 2023-11-17 LAB — POCT GLYCOSYLATED HEMOGLOBIN (HGB A1C): Hemoglobin A1C: 5.6 % (ref 4.0–5.6)

## 2023-11-17 MED ORDER — METFORMIN HCL ER 500 MG PO TB24: 500.0000 mg | ORAL_TABLET | Freq: Every day | ORAL | 1 refills | Status: DC

## 2023-11-17 MED ORDER — ALBUTEROL SULFATE HFA 108 (90 BASE) MCG/ACT IN AERS
2.0000 | INHALATION_SPRAY | RESPIRATORY_TRACT | 2 refills | Status: AC | PRN
Start: 2023-11-17 — End: ?

## 2023-11-17 NOTE — Assessment & Plan Note (Signed)
 Controlled.   Refill sent for Albuterol.

## 2023-11-17 NOTE — Assessment & Plan Note (Signed)
 Suspect this could be related to environmental allergies.  Exam stable. No red flags.  Trial of benadryl  at bedtime.  Start daily Claritin.   If not better, consider allergy referral.

## 2023-11-17 NOTE — Assessment & Plan Note (Addendum)
 Controlled. Hemoglobin A1c of 5.6 today. Commended patient on progress.   Discussed to continue to monitor diet and exercise.  Foot exam completed today.  Urine ACR-UTD.  No statin at this time-she is considering having another baby.  Declines prevnar 20 at this time.   Continue Metformin  XR 500 mg once daily. Refill sent.   Follow up in 6 months for DM and physical.

## 2023-11-17 NOTE — Patient Instructions (Addendum)
 Schedule your diabetic eye exam.  Pneumonia vaccine- schedule at the pharmacy or health department. Prevnar 20- is the name of the vaccine.   Start antihistamine (Claritin and benadryl  at bedtime for the next few times).   It is important that you improve your diet. Please limit carbohydrates in the form of white bread, rice, pasta, sweets, fast food, fried food, sugary drinks, etc. Increase your consumption of fresh fruits and vegetables, whole grains, lean protein.  Ensure you are consuming 64 ounces of water daily.  Refill sent for albuterol and metformin .   Follow up in 6 months.   It was a pleasure to see you today!

## 2023-11-17 NOTE — Progress Notes (Addendum)
 Established Patient Office Visit  Subjective   Patient ID: Victoria Hobbs, female    DOB: 11/24/1980  Age: 42 y.o. MRN: [id number]  Chief Complaint  Patient presents with   Diabetes    And A1c check   Eye Problem    With left eye x 3 weeks with itching and swelling off and on.     Diabetes Pertinent negatives for hypoglycemia include no dizziness, headaches or nervousness/anxiousness. Pertinent negatives for diabetes include no chest pain and no polydipsia.  Eye Problem  Pertinent negatives include no fever, nausea or vomiting.    Victoria Hobbs is a 43 year old female with past medical history of type 2 DM, sjogren syndrome, asthma, eczema presents today for a follow up.   Type 2 DM: Current medications include: Metformin  XR 500 mg once daily.   She is checking her blood glucose 1-2 times daily and is getting readings of 90s-119s.   Last A1C: 7.2 on 07/30/23 Last Eye Exam: due Last Foot Exam: due  Pneumonia Vaccination: due Urine Microalbumin: UTD.  Statin: child bearing age   Dietary changes since last visit: avoids carbs, eating lots of protein, salads, and smaller portions.   Exercise: once a day- cardio, bike or squat. 40 mins daily.   Left eye swelling- onset was three weeks ago. Intermittent ago. Three weeks it was the right eye. No recent new make up. She noticed that the 2 out of 3 times it was after she was sweeping. She has two dogs and one of them sheds a lot. She has itching on the outer eye and watery. She denies any other place. She denies any pain, discharge or vision problems.    Patient Active Problem List   Diagnosis Date Noted   Eye swelling, left 11/17/2023   Establishing care with new doctor, encounter for 08/19/2023   Type 2 diabetes mellitus (HCC) 06/23/2022   Nasal deviation 08/27/2021   Bilateral dry eyes 08/26/2021   DOE (dyspnea on exertion) 08/01/2021   Sjogren syndrome with other organ involvement (HCC) 08/01/2021   Seborrhea capitis  07/06/2021   Eczema 07/06/2021   Allergic rhinitis 07/06/2021   Asthma, well controlled 07/06/2021   Past Medical History:  Diagnosis Date   Allergy    Flagyl   Asthma    Gestational diabetes    History of gestational diabetes in prior pregnancy, currently pregnant 03/25/2022   History of miscarriage 04/20/2021   Fourth miscarriage with 2 births out of 6   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 07/2021 Cor CTA: Ca2+ 0. Nl cors. No significant noncardiac findings.   Sjogren's syndrome (HCC)    Uterine leiomyoma 01/29/2022   2 and 3 cm     Past Surgical History:  Procedure Laterality Date   BRAVO [facility] STUDY     CERVIX SURGERY     HERNIA REPAIR  2008   Umbilical and ventral   IUD REMOVAL  07/30/2023   TONSILLECTOMY     UMBILICAL HERNIA REPAIR  2010   Allergies  Allergen Reactions   Metronidazole Anaphylaxis and Hives   Shellfish Allergy Shortness Of Breath, Itching and Rash   Latex Rash         11/17/2023    9:22 AM 08/19/2023   11:19 AM 06/19/2022   11:07 AM  Depression screen PHQ 2/9  Decreased Interest 0 0 0  Down, Depressed, Hopeless 0 0 0  PHQ - 2 Score 0  0 0  Altered sleeping 0 0 0  Tired, decreased energy 0 1 0  Change in appetite 0 0 0  Feeling bad or failure about yourself  0 0 0  Trouble concentrating 0 0 0  Moving slowly or fidgety/restless 0 0 0  Suicidal thoughts 0 0 0  PHQ-9 Score 0 1 0  Difficult doing work/chores Not difficult at all Not difficult at all        11/17/2023    9:22 AM 08/19/2023   11:20 AM 06/13/2022   11:13 AM 05/02/2022    8:52 AM  GAD 7 : Generalized Anxiety Score  Nervous, Anxious, on Edge 0 0 0 0  Control/stop worrying 0 0 0 0  Worry too much - different things 0 0 0 0  Trouble relaxing 0 0 0 0  Restless 0 0 0 0  Easily annoyed or irritable 0 0 1 1  Afraid - awful might happen 0 0 0 0  Total GAD 7 Score 0 0 1 1  Anxiety Difficulty Not difficult at all Not difficult at all  Not difficult at all Not difficult at all      Review of Systems  Constitutional:  Negative for chills and fever.  Respiratory:  Negative for shortness of breath.   Cardiovascular:  Negative for chest pain.  Gastrointestinal:  Negative for abdominal pain, constipation, diarrhea, heartburn, nausea and vomiting.  Genitourinary:  Negative for dysuria, frequency and urgency.  Neurological:  Negative for dizziness and headaches.  Endo/Heme/Allergies:  Negative for polydipsia.  Psychiatric/Behavioral:  Negative for depression and suicidal ideas. The patient is not nervous/anxious.       Objective:     BP 118/66 (BP Location: Left Arm, Patient Position: Sitting, Cuff Size: Normal)   Pulse 78   Temp 98.2 F (36.8 C) (Oral)   Ht 5' 4.5" (1.638 m)   Wt 180 lb (81.6 kg)   LMP 11/02/2023 (Exact Date)   SpO2 98%   BMI 30.42 kg/m  BP Readings from Last 3 Encounters:  11/17/23 118/66  08/19/23 110/70  07/30/23 118/71   Wt Readings from Last 3 Encounters:  11/17/23 180 lb (81.6 kg)  08/19/23 188 lb (85.3 kg)  07/30/23 189 lb (85.7 kg)      Physical Exam Vitals and nursing note reviewed.  Constitutional:      Appearance: Normal appearance.  Eyes:     General: Lids are everted, no foreign bodies appreciated. Vision grossly intact.        Right eye: No foreign body, discharge or hordeolum.        Left eye: No foreign body, discharge or hordeolum.     Extraocular Movements: Extraocular movements intact.     Conjunctiva/sclera: Conjunctivae normal.      Comments: Left eye swelling  Cardiovascular:     Rate and Rhythm: Normal rate and regular rhythm.     Pulses: Normal pulses.     Heart sounds: Normal heart sounds.  Pulmonary:     Effort: Pulmonary effort is normal.     Breath sounds: Normal breath sounds.  Neurological:     Mental Status: She is alert and oriented to person, place, and time.  Psychiatric:        Mood and Affect: Mood normal.        Behavior: Behavior  normal.        Thought Content: Thought content normal.        Judgment: Judgment normal.      Results for orders  placed or performed in visit on 11/17/23  POCT HgB A1C  Result Value Ref Range   Hemoglobin A1C 5.6 4.0 - 5.6 %   HbA1c POC (<> result, manual entry)     HbA1c, POC (prediabetic range)     HbA1c, POC (controlled diabetic range)         The 10-year ASCVD risk score (Arnett DK, et al., 2019) is: 1.3%    Assessment & Plan:  Type 2 diabetes mellitus with hyperglycemia, without long-term current use of insulin  [facility]) Assessment & Plan: Controlled. Hemoglobin A1c of 5.6 today. Commended patient on progress.   Discussed to continue to monitor diet and exercise.  Foot exam completed today.  Urine ACR-UTD.  No statin at this time-she is considering having another baby.  Declines prevnar 20 at this time.   Continue Metformin  XR 500 mg once daily. Refill sent.   Follow up in 6 months for DM and physical.  Orders: -     POCT glycosylated hemoglobin (Hb A1C) -     metFORMIN  HCl ER; Take 1 tablet (500 mg total) by mouth daily with breakfast.  Dispense: 90 tablet; Refill: 1  Eye swelling, left Assessment & Plan: Suspect this could be related to environmental allergies.  Exam stable. No red flags.  Trial of benadryl  at bedtime.  Start daily Claritin.   If not better, consider allergy referral.    Asthma, mild intermittent, well-controlled Assessment & Plan: Controlled.   Refill sent for Albuterol.   Orders: -     Albuterol Sulfate HFA; Inhale 2 puffs into the lungs as needed for wheezing or shortness of breath.  Dispense: 1 each; Refill: 2     Return in about 6 months (around 05/18/2024) for diabetes follow up and physical. .    Jolanda Nation, NP

## 2023-11-20 ENCOUNTER — Ambulatory Visit: Admitting: General Practice

## 2024-04-06 ENCOUNTER — Encounter: Payer: Self-pay | Admitting: General Practice

## 2024-04-06 ENCOUNTER — Ambulatory Visit: Payer: Self-pay | Admitting: General Practice

## 2024-04-06 ENCOUNTER — Ambulatory Visit (INDEPENDENT_AMBULATORY_CARE_PROVIDER_SITE_OTHER)
Admission: RE | Admit: 2024-04-06 | Discharge: 2024-04-06 | Disposition: A | Source: Ambulatory Visit | Attending: General Practice | Admitting: General Practice

## 2024-04-06 ENCOUNTER — Ambulatory Visit: Admitting: General Practice

## 2024-04-06 VITALS — BP 120/68 | HR 65 | Temp 98.7°F | Ht 64.5 in | Wt 182.0 lb

## 2024-04-06 DIAGNOSIS — R1013 Epigastric pain: Secondary | ICD-10-CM | POA: Diagnosis not present

## 2024-04-06 DIAGNOSIS — Z7984 Long term (current) use of oral hypoglycemic drugs: Secondary | ICD-10-CM

## 2024-04-06 DIAGNOSIS — J4520 Mild intermittent asthma, uncomplicated: Secondary | ICD-10-CM | POA: Diagnosis not present

## 2024-04-06 DIAGNOSIS — E1165 Type 2 diabetes mellitus with hyperglycemia: Secondary | ICD-10-CM | POA: Diagnosis not present

## 2024-04-06 MED ORDER — SYMBICORT 80-4.5 MCG/ACT IN AERO: 2.0000 | INHALATION_SPRAY | Freq: Two times a day (BID) | RESPIRATORY_TRACT | 2 refills | Status: DC | PRN

## 2024-04-06 MED ORDER — FAMOTIDINE 20 MG PO TABS: 20.0000 mg | ORAL_TABLET | Freq: Every day | ORAL | 2 refills | Status: AC

## 2024-04-06 NOTE — Progress Notes (Signed)
 Established Patient Office Visit  Subjective   Patient ID: Victoria Hobbs, female    DOB: [birth year]  Age: 43 y.o. MRN: [id number]  Chief Complaint  Patient presents with   Abdominal Pain    Upper abd pain x 2 weeks or so. Patient states theres always a light pain but then gets sharp off and on. Patient thinks maybe her hernia is coming back; had hernia repair done in the past. Patient has been taking otc probiotic and fiber since pains started.     Abdominal Pain Associated symptoms include diarrhea and nausea. Pertinent negatives include no constipation, dysuria, fever, frequency, headaches or vomiting.    Victoria Hobbs is a 43 year old female with past medical history of asthma, DM2, eczema, sjogren syndrome, presents for an acute visit to discuss abdominal pain.  Discussed the use of AI scribe software for clinical note transcription with the patient, who gave verbal consent to proceed.  History of Present Illness Victoria Hobbs is a 43 year old female with Sjogren's syndrome who presents with abdominal pain and discomfort.  She has been experiencing abdominal pain for the past two weeks. Initially, the pain was light but has become intermittently sharp, though it is consistently present as discomfort rather than pain. The discomfort is localized to the mid-epigastric area. The discomfort is persistent and she has only recently started to pay attention to it.  She has a history of bloating, which improved after starting fiber and probiotics. Despite the reduction in bloating, the abdominal discomfort persists. No fever, chills, vomiting, or constipation, but there is occasional nausea and diarrhea. She attributes the diarrhea to either the probiotics or metformin , which she has been taking inconsistently since September. She resumed regular use of metformin  two weeks ago, coinciding with the onset of symptoms.  No urinary symptoms, chest pain, shortness of breath, dizziness, or headaches.  There are no recent changes in her symptoms related to her known autoimmune disorder, Sjogren's syndrome.    Patient Active Problem List   Diagnosis Date Noted   Epigastric pain 04/06/2024   Eye swelling, left 11/17/2023   Establishing care with new doctor, encounter for 08/19/2023   Type 2 diabetes mellitus (HCC) 06/23/2022   Nasal deviation 08/27/2021   Bilateral dry eyes 08/26/2021   DOE (dyspnea on exertion) 08/01/2021   Sjogren syndrome with other organ involvement 08/01/2021   Seborrhea capitis 07/06/2021   Eczema 07/06/2021   Allergic rhinitis 07/06/2021   Asthma, well controlled 07/06/2021   Past Medical History:  Diagnosis Date   Allergy    Flagyl   Asthma    Gestational diabetes    History of gestational diabetes in prior pregnancy, currently pregnant 03/25/2022   History of miscarriage 04/20/2021   Fourth miscarriage with 2 births out of 6   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 07/2021 Cor CTA: Ca2+ 0. Nl cors. No significant noncardiac findings.   Sjogren's syndrome    Uterine leiomyoma 01/29/2022   2 and 3 cm     Past Surgical History:  Procedure Laterality Date   BRAVO [facility] STUDY     CERVIX SURGERY     HERNIA REPAIR  2008   Umbilical and ventral   IUD REMOVAL  07/30/2023   TONSILLECTOMY     UMBILICAL HERNIA REPAIR  2010   Allergies  Allergen Reactions   Metronidazole Anaphylaxis and Hives   Shellfish Allergy Shortness Of Breath, Itching and Rash  Latex Rash         04/06/2024   10:49 AM 11/17/2023    9:22 AM 08/19/2023   11:19 AM  Depression screen PHQ 2/9  Decreased Interest 0 0 0  Down, Depressed, Hopeless 0 0 0  PHQ - 2 Score 0 0 0  Altered sleeping 0 0 0  Tired, decreased energy 0 0 1  Change in appetite 1 0 0  Feeling bad or failure about yourself  0 0 0  Trouble concentrating 0 0 0  Moving slowly or fidgety/restless 0 0 0  Suicidal thoughts 0 0 0  PHQ-9 Score 1 0 1  Difficult  doing work/chores Not difficult at all Not difficult at all Not difficult at all       04/06/2024   10:49 AM 11/17/2023    9:22 AM 08/19/2023   11:20 AM 06/13/2022   11:13 AM  GAD 7 : Generalized Anxiety Score  Nervous, Anxious, on Edge 0 0 0 0  Control/stop worrying 0 0 0 0  Worry too much - different things 0 0 0 0  Trouble relaxing 0 0 0 0  Restless 0 0 0 0  Easily annoyed or irritable 0 0 0 1  Afraid - awful might happen 0 0 0 0  Total GAD 7 Score 0 0 0 1  Anxiety Difficulty Not difficult at all Not difficult at all Not difficult at all Not difficult at all      Review of Systems  Constitutional:  Negative for chills and fever.  Respiratory:  Negative for shortness of breath.   Cardiovascular:  Negative for chest pain.  Gastrointestinal:  Positive for abdominal pain, diarrhea, heartburn and nausea. Negative for constipation and vomiting.  Genitourinary:  Negative for dysuria, frequency and urgency.  Neurological:  Negative for dizziness and headaches.  Endo/Heme/Allergies:  Negative for polydipsia.  Psychiatric/Behavioral:  Negative for depression and suicidal ideas. The patient is not nervous/anxious.       Objective:     BP 120/68   Pulse 65   Temp 98.7 F (37.1 C) (Oral)   Ht 5' 4.5 (1.638 m)   Wt 182 lb (82.6 kg)   SpO2 99%   BMI 30.76 kg/m  BP Readings from Last 3 Encounters:  04/06/24 120/68  11/17/23 118/66  08/19/23 110/70   Wt Readings from Last 3 Encounters:  04/06/24 182 lb (82.6 kg)  11/17/23 180 lb (81.6 kg)  08/19/23 188 lb (85.3 kg)      Physical Exam Vitals and nursing note reviewed.  Constitutional:      Appearance: Normal appearance.  Cardiovascular:     Rate and Rhythm: Normal rate and regular rhythm.     Pulses: Normal pulses.     Heart sounds: Normal heart sounds.  Pulmonary:     Effort: Pulmonary effort is normal.     Breath sounds: Normal breath sounds.  Abdominal:     General: Bowel sounds are decreased. There is no  distension.     Palpations: Abdomen is soft.     Tenderness: There is abdominal tenderness in the epigastric area.  Neurological:     Mental Status: She is alert and oriented to person, place, and time.  Psychiatric:        Mood and Affect: Mood normal.        Behavior: Behavior normal.        Thought Content: Thought content normal.        Judgment: Judgment normal.  No results found for any visits on 04/06/24.     The 10-year ASCVD risk score (Arnett DK, et al., 2019) is: 1.4%    Assessment & Plan:  Epigastric pain -     DG Abd 2 Views -     Famotidine; Take 1 tablet (20 mg total) by mouth daily.  Dispense: 30 tablet; Refill: 2  Type 2 diabetes mellitus with hyperglycemia, without long-term current use of insulin  (HCC)  Asthma, mild intermittent, well-controlled -     Symbicort; Inhale 2 puffs into the lungs 2 (two) times daily as needed (SOB/wheezing).  Dispense: 1 each; Refill: 2    Assessment and Plan Assessment & Plan Epigastric pain and discomfort Intermittent epigastric pain possibly due to metformin , hiatal hernia, or acid reflux. Bloating improved with fiber and probiotics. Nausea may be related to metformin  or probiotics. Hiatal hernias discussed as a potential cause of symptoms. - Order abdominal x-ray to evaluate for abnormalities. - Recommend famotidine 20 mg daily before breakfast for epigastric pain and potential acid reflux. - Offer anti-nausea medication prescription if nausea worsens.  Type 2 diabetes mellitus with hyperglycemia Type 2 diabetes managed with metformin . Recent inconsistency in adherence noted. - Continue metformin  as prescribed.   Return in about 4 weeks (around 05/04/2024) for abdominal pain; may cancel if better.SABRA Carrol Aurora, NP

## 2024-04-06 NOTE — Patient Instructions (Signed)
 Complete xray(s) prior to leaving today. I will notify you of your results once received.  Start Pepcid 20 mg once daily; 20 minute before breakfast.   Follow up in 4 weeks if not better.   It was a pleasure to see you today!

## 2024-04-13 ENCOUNTER — Ambulatory Visit: Admitting: General Practice

## 2024-05-06 ENCOUNTER — Ambulatory Visit: Admitting: General Practice

## 2024-05-17 ENCOUNTER — Encounter: Payer: Self-pay | Admitting: General Practice

## 2024-05-18 ENCOUNTER — Ambulatory Visit (INDEPENDENT_AMBULATORY_CARE_PROVIDER_SITE_OTHER): Admitting: General Practice

## 2024-05-18 ENCOUNTER — Encounter: Payer: Self-pay | Admitting: General Practice

## 2024-05-18 VITALS — BP 122/80 | HR 91 | Temp 98.4°F | Ht 64.5 in | Wt 183.0 lb

## 2024-05-18 DIAGNOSIS — Z0000 Encounter for general adult medical examination without abnormal findings: Secondary | ICD-10-CM | POA: Diagnosis not present

## 2024-05-18 DIAGNOSIS — E1165 Type 2 diabetes mellitus with hyperglycemia: Secondary | ICD-10-CM | POA: Diagnosis not present

## 2024-05-18 DIAGNOSIS — Z3201 Encounter for pregnancy test, result positive: Secondary | ICD-10-CM

## 2024-05-18 DIAGNOSIS — Z7984 Long term (current) use of oral hypoglycemic drugs: Secondary | ICD-10-CM

## 2024-05-18 DIAGNOSIS — E6609 Other obesity due to excess calories: Secondary | ICD-10-CM

## 2024-05-18 DIAGNOSIS — Z6830 Body mass index (BMI) 30.0-30.9, adult: Secondary | ICD-10-CM | POA: Diagnosis not present

## 2024-05-18 DIAGNOSIS — E66811 Obesity, class 1: Secondary | ICD-10-CM | POA: Diagnosis not present

## 2024-05-18 DIAGNOSIS — L308 Other specified dermatitis: Secondary | ICD-10-CM

## 2024-05-18 DIAGNOSIS — L210 Seborrhea capitis: Secondary | ICD-10-CM

## 2024-05-18 DIAGNOSIS — M3509 Sicca syndrome with other organ involvement: Secondary | ICD-10-CM

## 2024-05-18 LAB — TSH: TSH: 1.6 u[IU]/mL (ref 0.35–5.50)

## 2024-05-18 LAB — LIPID PANEL
Cholesterol: 151 mg/dL (ref 0–200)
HDL: 42.8 mg/dL (ref >39.00)
LDL Cholesterol: 94 mg/dL (ref 0–99)
NonHDL: 107.82
Total CHOL/HDL Ratio: 4
Triglycerides: 68 mg/dL (ref 0.0–149.0)
VLDL: 13.6 mg/dL (ref 0.0–40.0)

## 2024-05-18 LAB — HCG, SERUM, QUALITATIVE: Preg, Serum: NEGATIVE

## 2024-05-18 LAB — HEMOGLOBIN A1C: Hgb A1c MFr Bld: 6.3 % (ref 4.6–6.5)

## 2024-05-18 MED ORDER — TRIAMCINOLONE ACETONIDE 0.1 % EX CREA: 1.0000 | TOPICAL_CREAM | Freq: Two times a day (BID) | CUTANEOUS | 1 refills | Status: AC

## 2024-05-18 MED ORDER — FLUOCINOLONE ACETONIDE SCALP 0.01 % EX OIL: TOPICAL_OIL | CUTANEOUS | 1 refills | Status: AC

## 2024-05-18 NOTE — Assessment & Plan Note (Signed)
 Controlled.  A1c pending.  Foot exam UTD.  Urine ACR- UTD.  Statin- patient is pregnant.  Declines prevnar 20 at this time.   Continue Metformin  XR 500 mg once daily.   F/u in 6 months.

## 2024-05-18 NOTE — Patient Instructions (Addendum)
 Stop by the lab prior to leaving today. I will notify you of your results once received.   Schedule diabetic eye exam.   I will send in the inhaler.   Schedule GYN appt once labs results.  Follow up in 6 months.   It was a pleasure to see you today!

## 2024-05-18 NOTE — Assessment & Plan Note (Signed)
 Controlled.

## 2024-05-18 NOTE — Assessment & Plan Note (Signed)
 Refill sent for Fluocinolone Acetonide scalp oil.

## 2024-05-18 NOTE — Assessment & Plan Note (Signed)
 Immunizations declines influenza and pneumonia vaccine. Pap smear UTD. Mammogram UTD   Discussed the importance of a healthy diet and regular exercise in order for weight loss, and to reduce the risk of further co-morbidity.  Exam stable. Labs pending.  Follow up in 1 year for repeat physical.

## 2024-05-18 NOTE — Assessment & Plan Note (Signed)
 Controlled.  Continue Triamcinolone acetonide BID. Refill sent.

## 2024-05-18 NOTE — Assessment & Plan Note (Signed)
 Multiple home urine tests positive.  Patient would like a blood test completed.   She has a GYN and she will call and schedule appt.

## 2024-05-18 NOTE — Progress Notes (Signed)
 Established Patient Office Visit  Subjective   Patient ID: Victoria Hobbs, female    DOB: 1980-08-28  Age: 43 y.o. MRN: [id number]  Chief Complaint  Patient presents with   Annual Exam   Diabetes    Patient following up on DM; patient currently on metformin  500 mg daily.     Diabetes Pertinent negatives for hypoglycemia include no dizziness, headaches, nervousness/anxiousness or seizures. Pertinent negatives for diabetes include no blurred vision, no chest pain, no weakness and no weight loss.    Victoria Hobbs is a 43 year old female with past medical history of allergic rhinitis, asthma, DM2, eczema, sjogren syndrome, presents today for complete physical and follow up of chronic conditions.  Immunizations: -Tetanus: Completed in 2023 -Influenza: due -Pneumonia: due -HPV: unsure  Diet: Fair diet.  Exercise: No regular exercise.  Eye exam: Completed last year.   Dental exam: Completes semi-annually    Pap Smear: Completed in 2023 Mammogram: Completed in 2025   Patient Active Problem List   Diagnosis Date Noted   Class 1 obesity due to excess calories with serious comorbidity and body mass index (BMI) of 30.0 to 30.9 in adult 05/18/2024   Encounter for screening and preventative care 05/18/2024   Positive urine pregnancy test 05/18/2024   Epigastric pain 04/06/2024   Eye swelling, left 11/17/2023   Establishing care with new doctor, encounter for 08/19/2023   Type 2 diabetes mellitus (HCC) 06/23/2022   Nasal deviation 08/27/2021   Bilateral dry eyes 08/26/2021   DOE (dyspnea on exertion) 08/01/2021   Sjogren syndrome with other organ involvement 08/01/2021   Seborrhea capitis 07/06/2021   Eczema 07/06/2021   Allergic rhinitis 07/06/2021   Asthma, well controlled 07/06/2021   Past Medical History:  Diagnosis Date   Allergy    Flagyl   Asthma    Gestational diabetes    History of gestational diabetes in prior pregnancy, currently pregnant 03/25/2022   History of  miscarriage 04/20/2021   Fourth miscarriage with 2 births out of 6   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 07/2021 Cor CTA: Ca2+ 0. Nl cors. No significant noncardiac findings.   Sjogren's syndrome    Uterine leiomyoma 01/29/2022   2 and 3 cm     Past Surgical History:  Procedure Laterality Date   BRAVO [facility] STUDY     CERVIX SURGERY     HERNIA REPAIR  2008   Umbilical and ventral   IUD REMOVAL  07/30/2023   TONSILLECTOMY     UMBILICAL HERNIA REPAIR  2010   Allergies  Allergen Reactions   Metronidazole Anaphylaxis and Hives   Shellfish Allergy Shortness Of Breath, Itching and Rash   Latex Rash         05/18/2024    9:21 AM 04/06/2024   10:49 AM 11/17/2023    9:22 AM  Depression screen PHQ 2/9  Decreased Interest 0 0 0  Down, Depressed, Hopeless 0 0 0  PHQ - 2 Score 0 0 0  Altered sleeping 0 0 0  Tired, decreased energy 1 0 0  Change in appetite 0 1 0  Feeling bad or failure about yourself  0 0 0  Trouble concentrating 1 0 0  Moving slowly or fidgety/restless 0 0 0  Suicidal thoughts 0 0 0  PHQ-9 Score 2 1  0   Difficult doing work/chores Somewhat difficult Not difficult at all Not difficult at all  Data saved with a previous flowsheet row definition       05/18/2024    9:21 AM 04/06/2024   10:49 AM 11/17/2023    9:22 AM 08/19/2023   11:20 AM  GAD 7 : Generalized Anxiety Score  Nervous, Anxious, on Edge 0 0 0 0  Control/stop worrying 0 0 0 0  Worry too much - different things 0 0 0 0  Trouble relaxing 0 0 0 0  Restless 0 0 0 0  Easily annoyed or irritable 0 0 0 0  Afraid - awful might happen 0 0 0 0  Total GAD 7 Score 0 0 0 0  Anxiety Difficulty Not difficult at all Not difficult at all Not difficult at all Not difficult at all      Review of Systems  Constitutional:  Negative for chills, fever, malaise/fatigue and weight loss.  HENT:  Negative for congestion, ear discharge, ear pain, hearing loss,  nosebleeds, sinus pain, sore throat and tinnitus.   Eyes:  Negative for blurred vision, double vision, pain, discharge and redness.  Respiratory:  Negative for cough, shortness of breath, wheezing and stridor.   Cardiovascular:  Negative for chest pain, palpitations and leg swelling.  Gastrointestinal:  Negative for abdominal pain, constipation, diarrhea, heartburn, nausea and vomiting.  Genitourinary:  Negative for dysuria, frequency and urgency.  Musculoskeletal:  Negative for myalgias.  Skin:  Negative for rash.  Neurological:  Negative for dizziness, tingling, seizures, weakness and headaches.  Psychiatric/Behavioral:  Negative for depression, substance abuse and suicidal ideas. The patient is not nervous/anxious.       Objective:     BP 122/80   Pulse 91   Temp 98.4 F (36.9 C) (Temporal)   Ht 5' 4.5 (1.638 m)   Wt 183 lb (83 kg)   SpO2 97%   BMI 30.93 kg/m  BP Readings from Last 3 Encounters:  05/18/24 122/80  04/06/24 120/68  11/17/23 118/66   Wt Readings from Last 3 Encounters:  05/18/24 183 lb (83 kg)  04/06/24 182 lb (82.6 kg)  11/17/23 180 lb (81.6 kg)      Physical Exam Vitals and nursing note reviewed.  Constitutional:      Appearance: Normal appearance.  HENT:     Head: Normocephalic and atraumatic.     Right Ear: Tympanic membrane, ear canal and external ear normal.     Left Ear: Tympanic membrane, ear canal and external ear normal.     Nose: Nose normal.     Mouth/Throat:     Mouth: Mucous membranes are moist.     Pharynx: Oropharynx is clear.  Eyes:     Conjunctiva/sclera: Conjunctivae normal.     Pupils: Pupils are equal, round, and reactive to light.  Cardiovascular:     Rate and Rhythm: Normal rate and regular rhythm.     Pulses: Normal pulses.     Heart sounds: Normal heart sounds.  Pulmonary:     Effort: Pulmonary effort is normal.     Breath sounds: Normal breath sounds.  Abdominal:     General: Abdomen is flat. Bowel sounds are  normal.     Palpations: Abdomen is soft.  Musculoskeletal:        General: Normal range of motion.     Cervical back: Normal range of motion.  Skin:    General: Skin is warm and dry.     Capillary Refill: Capillary refill takes less than 2 seconds.  Neurological:     General: No focal deficit  present.     Mental Status: She is alert and oriented to person, place, and time. Mental status is at baseline.  Psychiatric:        Mood and Affect: Mood normal.        Behavior: Behavior normal.        Thought Content: Thought content normal.        Judgment: Judgment normal.      No results found for any visits on 05/18/24.     The 10-year ASCVD risk score (Arnett DK, et al., 2019) is: 1.6%    Assessment & Plan:  Encounter for screening and preventative care Assessment & Plan: Immunizations declines influenza and pneumonia vaccine. Pap smear UTD. Mammogram UTD   Discussed the importance of a healthy diet and regular exercise in order for weight loss, and to reduce the risk of further co-morbidity.  Exam stable. Labs pending.  Follow up in 1 year for repeat physical.   Orders: -     Lipid panel  Type 2 diabetes mellitus with hyperglycemia, without long-term current use of insulin  (HCC) Assessment & Plan: Controlled.  A1c pending.  Foot exam UTD.  Urine ACR- UTD.  Statin- patient is pregnant.  Declines prevnar 20 at this time.   Continue Metformin  XR 500 mg once daily.   F/u in 6 months.  Orders: -     Hemoglobin A1c  Class 1 obesity due to excess calories with serious comorbidity and body mass index (BMI) of 30.0 to 30.9 in adult Assessment & Plan: Discussed the importance of healthy diet and exercise to affect sustainable weight loss.   Orders: -     TSH  Positive urine pregnancy test Assessment & Plan: Multiple home urine tests positive.  Patient would like a blood test completed.   She has a GYN and she will call and schedule appt.  Orders: -      hCG, serum, qualitative  Other eczema Assessment & Plan: Controlled.  Continue Triamcinolone acetonide BID. Refill sent.  Orders: -     Triamcinolone Acetonide; Apply 1 Application topically 2 (two) times daily.  Dispense: 453.6 g; Refill: 1 -     Fluocinolone Acetonide Scalp; Apply topically  Dispense: 20 mL; Refill: 1  Seborrhea capitis Assessment & Plan: Refill sent for Fluocinolone Acetonide scalp oil.   Orders: -     Fluocinolone Acetonide Scalp; Apply topically  Dispense: 20 mL; Refill: 1  Sjogren syndrome with other organ involvement Assessment & Plan: Controlled.       Return in about 6 months (around 11/16/2024) for chronic care management.SABRA Carrol Aurora, NP

## 2024-05-18 NOTE — Assessment & Plan Note (Signed)
 Discussed the importance of healthy diet and exercise to affect sustainable weight loss.

## 2024-05-19 ENCOUNTER — Ambulatory Visit: Payer: Self-pay | Admitting: General Practice

## 2024-05-19 ENCOUNTER — Ambulatory Visit: Payer: Self-pay

## 2024-05-19 ENCOUNTER — Inpatient Hospital Stay (HOSPITAL_COMMUNITY)
Admission: AD | Admit: 2024-05-19 | Discharge: 2024-05-19 | Discharge status: Against Medical Advice | Attending: Family Medicine | Admitting: Family Medicine

## 2024-05-19 NOTE — Telephone Encounter (Signed)
 FYI Only or Action Required?: FYI only for provider: Pt wanted to review lab work.  Patient was last seen in primary care on 05/18/2024 by Vincente Shivers, NP.  Called Nurse Triage reporting Advice Only.   Symptoms began na.  Interventions attempted: Other: na.  Symptoms are: stable.  Triage Disposition: Information or Advice Only Call  Patient/caregiver understands and will follow disposition?: Yes - pt will follow up with Gyn.               Copied from CRM #[id number]. Topic: Clinical - Lab/Test Results >> May 19, 2024 12:32 PM Rea ORN wrote: Reason for CRM: Pt needs clarification on pregnancy test lab work. Reason for Disposition  Health information question, no triage required and triager able to answer question  Answer Assessment - Initial Assessment Questions 1. REASON FOR CALL: What is the main reason for your call? or How can I best help you?     Pt wanted re-assurance that pregnancy test was negative 2. SYMPTOMS : Do you have any symptoms?      Positive home pregnancy test 3. OTHER QUESTIONS: Do you have any other questions?     no  Protocols used: Information Only Call - No Triage-A-AH

## 2024-05-19 NOTE — Progress Notes (Signed)
 Pt not in lobby. Will be discharged off [facility] [employer], left prior to being triaged & evaluated

## 2024-05-19 NOTE — Progress Notes (Signed)
 Pt called to be triaged, not in MAU lobby or seating area outside og MAU Registration.

## 2024-05-19 NOTE — Progress Notes (Signed)
 Pt not in lobby or area outside MAU registration

## 2024-05-19 NOTE — Telephone Encounter (Signed)
 Looks like patient called the office before [place] sent her messages on mychart. Patient has read messages and will follow up with GYN.

## 2024-05-23 ENCOUNTER — Other Ambulatory Visit: Payer: Self-pay | Admitting: General Practice

## 2024-05-23 DIAGNOSIS — M3509 Sicca syndrome with other organ involvement: Secondary | ICD-10-CM

## 2024-05-23 NOTE — Telephone Encounter (Signed)
 Called and discussed negative pregnancy result with patient at length.   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   She has not established with rheumatology since moving to the area. Referral placed for [facility].

## 2024-06-09 ENCOUNTER — Encounter: Payer: Self-pay | Admitting: General Practice

## 2024-06-22 ENCOUNTER — Ambulatory Visit: Admitting: General Practice

## 2024-06-28 ENCOUNTER — Ambulatory Visit: Admitting: General Practice

## 2024-06-28 ENCOUNTER — Encounter: Payer: Self-pay | Admitting: General Practice

## 2024-06-28 VITALS — BP 110/76 | HR 82 | Temp 98.1°F | Ht 64.5 in | Wt 185.0 lb

## 2024-06-28 DIAGNOSIS — M3509 Sicca syndrome with other organ involvement: Secondary | ICD-10-CM | POA: Diagnosis not present

## 2024-06-28 DIAGNOSIS — E785 Hyperlipidemia, unspecified: Secondary | ICD-10-CM | POA: Insufficient documentation

## 2024-06-28 DIAGNOSIS — E1165 Type 2 diabetes mellitus with hyperglycemia: Secondary | ICD-10-CM | POA: Diagnosis not present

## 2024-06-28 DIAGNOSIS — L750 Bromhidrosis: Secondary | ICD-10-CM | POA: Diagnosis not present

## 2024-06-28 DIAGNOSIS — B370 Candidal stomatitis: Secondary | ICD-10-CM | POA: Diagnosis not present

## 2024-06-28 MED ORDER — NYSTATIN 100000 UNIT/ML MT SUSP: 5.0000 mL | Freq: Four times a day (QID) | OROMUCOSAL | 0 refills | Status: AC

## 2024-06-28 NOTE — Patient Instructions (Addendum)
 Schedule diabetes eye  exam.   Start Nystatin  mouthwash four times daily for oral thrush.   Make sure you are rinsing your mouth after using inhalers.   Follow up in as scheduled.  It was a pleasure to see you today!

## 2024-06-28 NOTE — Progress Notes (Signed)
 "  Established Patient Office Visit  Subjective   Patient ID: Victoria Hobbs, female    DOB: 1980-08-28  Age: 44 y.o. MRN: [id number]  Chief Complaint  Patient presents with   body odor    Patients husband tells her she smells like metal/iron. Patient states husband was sick the week before this.     HPI  Victoria Hobbs is 44 year old female with past medical history of DM2, asthma, allergic rhinitis, eczema, sjogren syndrome, presents today for an acute visit.   Discussed the use of AI scribe software for clinical note transcription with the patient, who gave verbal consent to proceed.  History of Present Illness Victoria Hobbs is a 44 year old female with diabetes and Sjogren's syndrome who presents with concerns of a metallic smell.  Her husband noticed a metallic smell when she entered a room, which he mentioned frequently for about a week around the time she scheduled the appointment at the end of last year. He has since stopped mentioning it, and she is unsure if the smell persists or if he has become accustomed to it. Her daughter did not notice any metallic smell, only a fruity scent from her perfume.  She is currently using Symbicort  and has developed oral thrush, which she attributes to the inhaler. She has been using baking soda to manage it, which has helped, but she is concerned about the appearance of her tongue. She is unsure if she has been rinsing her mouth adequately after using the inhaler.  No fever, chills, nausea, vomiting, urinary symptoms, abdominal symptoms, diarrhea, dizziness, headaches, or vaginal symptoms such as discharge or itching.     Patient Active Problem List   Diagnosis Date Noted   Oral thrush 06/28/2024   Abnormal body odor 06/28/2024   Hyperlipidemia LDL goal <70 06/28/2024   Class 1 obesity due to excess calories with serious comorbidity and body mass index (BMI) of 30.0 to 30.9 in adult 05/18/2024   Encounter for screening and preventative care  05/18/2024   Positive urine pregnancy test 05/18/2024   Epigastric pain 04/06/2024   Eye swelling, left 11/17/2023   Establishing care with new doctor, encounter for 08/19/2023   Type 2 diabetes mellitus (HCC) 06/23/2022   Nasal deviation 08/27/2021   Bilateral dry eyes 08/26/2021   DOE (dyspnea on exertion) 08/01/2021   Sjogren syndrome with other organ involvement 08/01/2021   Seborrhea capitis 07/06/2021   Eczema 07/06/2021   Allergic rhinitis 07/06/2021   Asthma, well controlled 07/06/2021   Past Medical History:  Diagnosis Date   Allergy    Flagyl   Asthma    Gestational diabetes    History of gestational diabetes in prior pregnancy, currently pregnant 03/25/2022   History of miscarriage 04/20/2021   Fourth miscarriage with 2 births out of 6   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 07/2021 Cor CTA: Ca2+ 0. Nl cors. No significant noncardiac findings.   Sjogren's syndrome    Uterine leiomyoma 01/29/2022   2 and 3 cm     Past Surgical History:  Procedure Laterality Date   BRAVO [facility] STUDY     CERVIX SURGERY     HERNIA REPAIR  2008   Umbilical and ventral   IUD REMOVAL  07/30/2023   TONSILLECTOMY     UMBILICAL HERNIA REPAIR  2010   Allergies[1]       06/28/2024   10:54 AM 05/18/2024    9:21  AM 04/06/2024   10:49 AM  Depression screen PHQ 2/9  Decreased Interest 0 0 0  Down, Depressed, Hopeless 0 0 0  PHQ - 2 Score 0 0 0  Altered sleeping 0 0 0  Tired, decreased energy 0 1 0  Change in appetite 0 0 1  Feeling bad or failure about yourself  0 0 0  Trouble concentrating 1 1 0  Moving slowly or fidgety/restless 0 0 0  Suicidal thoughts 0 0 0  PHQ-9 Score 1 2 1    Difficult doing work/chores Very difficult Somewhat difficult Not difficult at all     Data saved with a previous flowsheet row definition       06/28/2024   10:54 AM 05/18/2024    9:21 AM 04/06/2024   10:49 AM 11/17/2023    9:22 AM  GAD 7 :  Generalized Anxiety Score  Nervous, Anxious, on Edge 0 0 0 0  Control/stop worrying 0 0 0 0  Worry too much - different things 0 0 0 0  Trouble relaxing 0 0 0 0  Restless 0 0 0 0  Easily annoyed or irritable 1 0 0 0  Afraid - awful might happen 0 0 0 0  Total GAD 7 Score 1 0 0 0  Anxiety Difficulty Somewhat difficult Not difficult at all Not difficult at all Not difficult at all      Review of Systems  Constitutional:  Negative for chills and fever.  Respiratory:  Negative for shortness of breath.   Cardiovascular:  Negative for chest pain.  Gastrointestinal:  Negative for abdominal pain, constipation, diarrhea, heartburn, nausea and vomiting.  Genitourinary:  Negative for dysuria, frequency and urgency.  Neurological:  Negative for dizziness and headaches.  Endo/Heme/Allergies:  Negative for polydipsia.  Psychiatric/Behavioral:  Negative for depression and suicidal ideas. The patient is not nervous/anxious.       Objective:     BP 110/76   Pulse 82   Temp 98.1 F (36.7 C) (Temporal)   Ht 5' 4.5 (1.638 m)   Wt 185 lb (83.9 kg)   SpO2 99%   BMI 31.26 kg/m  BP Readings from Last 3 Encounters:  06/28/24 110/76  05/18/24 122/80  04/06/24 120/68   Wt Readings from Last 3 Encounters:  06/28/24 185 lb (83.9 kg)  05/18/24 183 lb (83 kg)  04/06/24 182 lb (82.6 kg)      Physical Exam Vitals and nursing note reviewed.  Constitutional:      Appearance: Normal appearance.  Cardiovascular:     Rate and Rhythm: Normal rate and regular rhythm.     Pulses: Normal pulses.     Heart sounds: Normal heart sounds.  Pulmonary:     Effort: Pulmonary effort is normal.     Breath sounds: Normal breath sounds.  Neurological:     Mental Status: She is alert and oriented to person, place, and time.  Psychiatric:        Mood and Affect: Mood normal.        Behavior: Behavior normal.        Thought Content: Thought content normal.        Judgment: Judgment normal.      No  results found for any visits on 06/28/24.     The 10-year ASCVD risk score (Arnett DK, et al., 2019) is: 1.1%    Assessment & Plan:  Oral thrush -     Nystatin ; Take 5 mLs (500,000 Units total) by mouth 4 (four) times daily.  Dispense: 60 mL; Refill: 0  Abnormal body odor Assessment & Plan: Unclear etiology.  No odor today.  No red flags on exam.   Asked patient to keep journal if symptoms return.    Type 2 diabetes mellitus with hyperglycemia, without long-term current use of insulin  (HCC)  Hyperlipidemia LDL goal <70  Sjogren syndrome with other organ involvement    Assessment and Plan Assessment & Plan Oral candidiasis Likely due to inadequate rinsing after using Symbicort  inhaler. White coating on the tongue consistent with thrush. - Prescribed nystatin  mouth swish, use up to four times a day until white coating resolves. - Instructed to rinse mouth thoroughly with water after using inhaler.  Type 2 diabetes mellitus LDL cholesterol elevated at 94 mg/dL, above target due to diabetes. No statin therapy due to potential pregnancy. - Scheduled diabetes eye exam. - Continue current diabetes management plan.  Hyperlipidemia LDL cholesterol elevated at 94 mg/dL. No statin therapy due to potential pregnancy. - Continue monitoring lipid levels.  Sjogren syndrome with other organ involvement Scheduled for rheumatology follow-up in March for Sjogren syndrome management. - Continue current management plan for Sjogren syndrome.   Return if symptoms worsen or fail to improve.    Carrol Aurora, NP     [1]  Allergies Allergen Reactions   Metronidazole Anaphylaxis and Hives   Shellfish Allergy Shortness Of Breath, Itching and Rash   Latex Rash   "

## 2024-06-28 NOTE — Assessment & Plan Note (Signed)
 Unclear etiology.  No odor today.  No red flags on exam.   Asked patient to keep journal if symptoms return.

## 2024-06-30 ENCOUNTER — Other Ambulatory Visit: Payer: Self-pay | Admitting: General Practice

## 2024-06-30 DIAGNOSIS — E1165 Type 2 diabetes mellitus with hyperglycemia: Secondary | ICD-10-CM

## 2024-07-14 ENCOUNTER — Encounter: Payer: Self-pay | Admitting: General Practice

## 2024-08-10 ENCOUNTER — Encounter

## 2024-08-31 ENCOUNTER — Encounter: Admitting: Certified Nurse Midwife

## 2024-09-07 ENCOUNTER — Ambulatory Visit

## 2024-11-16 ENCOUNTER — Ambulatory Visit: Admitting: General Practice
